# Patient Record
Sex: Female | Born: 1990
Health system: Southern US, Community
[De-identification: ages and names within clinical notes are randomized; demographics above are authoritative.]

## PROBLEM LIST (undated history)

## (undated) DIAGNOSIS — T7840XA Allergy, unspecified, initial encounter: Secondary | ICD-10-CM

## (undated) DIAGNOSIS — F32A Depression, unspecified: Secondary | ICD-10-CM

## (undated) DIAGNOSIS — O139 Gestational [pregnancy-induced] hypertension without significant proteinuria, unspecified trimester: Secondary | ICD-10-CM

## (undated) DIAGNOSIS — K219 Gastro-esophageal reflux disease without esophagitis: Secondary | ICD-10-CM

## (undated) DIAGNOSIS — F419 Anxiety disorder, unspecified: Secondary | ICD-10-CM

## (undated) DIAGNOSIS — E039 Hypothyroidism, unspecified: Secondary | ICD-10-CM

## (undated) HISTORY — DX: Depression, unspecified: F32.A

## (undated) HISTORY — DX: Allergy, unspecified, initial encounter: T78.40XA

## (undated) HISTORY — PX: NO PAST SURGERIES: SHX2092

## (undated) HISTORY — DX: Anxiety disorder, unspecified: F41.9

## (undated) HISTORY — DX: Gastro-esophageal reflux disease without esophagitis: K21.9

## (undated) HISTORY — DX: Hypothyroidism, unspecified: E03.9

---

## 2019-12-10 ENCOUNTER — Encounter: Payer: Self-pay | Admitting: Family Medicine

## 2019-12-10 ENCOUNTER — Ambulatory Visit (INDEPENDENT_AMBULATORY_CARE_PROVIDER_SITE_OTHER): Payer: 59 | Admitting: Family Medicine

## 2019-12-10 ENCOUNTER — Other Ambulatory Visit: Payer: Self-pay

## 2019-12-10 VITALS — BP 124/80 | HR 89 | Resp 12 | Ht 64.0 in | Wt 262.0 lb

## 2019-12-10 DIAGNOSIS — G4700 Insomnia, unspecified: Secondary | ICD-10-CM | POA: Diagnosis not present

## 2019-12-10 DIAGNOSIS — J4520 Mild intermittent asthma, uncomplicated: Secondary | ICD-10-CM

## 2019-12-10 DIAGNOSIS — Z1159 Encounter for screening for other viral diseases: Secondary | ICD-10-CM

## 2019-12-10 DIAGNOSIS — IMO0001 Reserved for inherently not codable concepts without codable children: Secondary | ICD-10-CM | POA: Insufficient documentation

## 2019-12-10 MED ORDER — FLUTICASONE-SALMETEROL 250-50 MCG/DOSE IN AEPB: 1.0000 | INHALATION_SPRAY | Freq: Two times a day (BID) | RESPIRATORY_TRACT | 2 refills | Status: DC

## 2019-12-10 NOTE — Assessment & Plan Note (Signed)
Chronic. Recommend trying to stop Dramamine. Good sleep hygiene. Melatonin 5 to 10 mg 2 to 3 hours before bedtime may help. Follow-up in 2 months.

## 2019-12-10 NOTE — Progress Notes (Signed)
HPI: Sarah Whitney is a 29 y.o. female, who is here today to establish care.  Former PCP: N/A. She moved from [place] to [place] in 03/2019. Last preventive routine visit: 2019.  Chronic medical problems: Asthma, depression, anxiety,and obesity among some.  Diagnosed with anxiety and depression when she was in high school. Currently she is not on medication. She does not think these are active problems at this time.  She has been frustrated because she is not working, stays at home.She does not feel useful. She thinks she may have ADHD and has an appt with [facility] for screening.  She lives with her husband and son. In the past she has used marijuana.  Concerns today:  Asthma: Problem was worse during childhood. She is using albuterol inhaler every 2 days. Until 2 years ago she was on Advair. Nocturnal symptoms are seldom. Symptoms are exacerbated by exertion: Cough, wheezing, and dyspnea. She has not had fever, chills, abnormal weight loss, CP, or palpitations.  Insomnia: Problem has been going on for many years. Difficulty falling asleep,can not "shut my brain down." Negative for PHx and FHx of bipolar disorder.  Currently she is on Dramamine at 50 mg daily. Melatonin did not help. Sleeping about 6-7 hours.  Difficulty losing weight:She stopped sodas in 04/2019. Before pregnancy she was 160-170 Lb.  Cooking her meals. She is not counting calories. Started [employer] 2 weeks ago and has not noted wt loss. She drinks seltzer mixed drinks about 3 times per week.  Review of Systems  Constitutional: Negative for activity change, appetite change, fatigue and fever.  HENT: Negative for mouth sores, nosebleeds and sore throat.   Eyes: Negative for redness and visual disturbance.  Respiratory: Negative for cough, shortness of breath and wheezing.   Cardiovascular: Negative for chest pain, palpitations and leg swelling.   Gastrointestinal: Negative for abdominal pain, nausea and vomiting.       Negative for changes in bowel habits.  Genitourinary: Negative for decreased urine volume and hematuria.  Musculoskeletal: Negative for gait problem and myalgias.  Neurological: Negative for syncope, weakness and headaches.  Psychiatric/Behavioral: Negative for confusion and hallucinations.  Rest see pertinent positives and negatives per HPI.  No current outpatient medications on file prior to visit.   No current facility-administered medications on file prior to visit.   Past Medical History:  Diagnosis Date  . Allergy   . Depression    No Known Allergies  History reviewed. No pertinent family history.  Social History   Socioeconomic History  . Marital status: Unknown    Spouse name: Not on file  . Number of children: Not on file  . Years of education: Not on file  . Highest education level: Not on file  Occupational History  . Not on file  Tobacco Use  . Smoking status: Never Smoker  . Smokeless tobacco: Never Used  Substance and Sexual Activity  . Alcohol use: Not on file  . Drug use: Not on file  . Sexual activity: Not on file  Other Topics Concern  . Not on file  Social History Narrative  . Not on file   Social Determinants of Health   Financial Resource Strain:   . Difficulty of Paying Living Expenses:   Food Insecurity:   . Worried About Programme researcher, broadcasting/film/video in the Last Year:   . Barista in the Last Year:   Transportation Needs:   . Freight forwarder (Medical):   Marland Kitchen  Lack of Transportation (Non-Medical):   Physical Activity:   . Days of Exercise per Week:   . Minutes of Exercise per Session:   Stress:   . Feeling of Stress :   Social Connections:   . Frequency of Communication with Friends and Family:   . Frequency of Social Gatherings with Friends and Family:   . Attends Religious Services:   . Active Member of Clubs or Organizations:   . Attends Tax inspector Meetings:   Marland Kitchen Marital Status:    Vitals:   12/10/19 1132  BP: 124/80  Pulse: 89  Resp: 12  SpO2: 97%    Body mass index is 44.97 kg/m.  Physical Exam Vitals and nursing note reviewed.  Constitutional:      General: She is not in acute distress.    Appearance: She is well-developed.  HENT:     Head: Normocephalic and atraumatic.     Nose: Septal deviation and rhinorrhea present.     Comments: Narrowed left nostril.    Mouth/Throat:     Mouth: Mucous membranes are moist.     Pharynx: Oropharynx is clear.  Eyes:     Conjunctiva/sclera: Conjunctivae normal.     Pupils: Pupils are equal, round, and reactive to light.  Cardiovascular:     Rate and Rhythm: Normal rate and regular rhythm.     Pulses:          Dorsalis pedis pulses are 2+ on the right side and 2+ on the left side.     Heart sounds: No murmur heard.   Pulmonary:     Effort: Pulmonary effort is normal. No respiratory distress.     Breath sounds: Normal breath sounds.  Abdominal:     Palpations: Abdomen is soft. There is no hepatomegaly or mass.     Tenderness: There is no abdominal tenderness.  Lymphadenopathy:     Cervical: No cervical adenopathy.  Skin:    General: Skin is warm.     Findings: No erythema or rash.  Neurological:     Mental Status: She is alert and oriented to person, place, and time.     Cranial Nerves: No cranial nerve deficit.     Gait: Gait normal.  Psychiatric:     Comments: Well groomed, good eye contact.    ASSESSMENT AND PLAN:  [name] was seen today for establish care.  Diagnoses and all orders for this visit: Orders Placed This Encounter  Procedures  . Basic metabolic panel  . TSH  . Hepatitis C antibody   Lab Results  Component Value Date   CREATININE 0.94 12/10/2019   BUN 11 12/10/2019   NA 140 12/10/2019   K 4.4 12/10/2019   CL 106 12/10/2019   CO2 23 12/10/2019   Lab Results  Component Value Date   TSH 5.13 (H) 12/10/2019    Insomnia  disorder Chronic. Recommend trying to stop Dramamine. Good sleep hygiene. Melatonin 5 to 10 mg 2 to 3 hours before bedtime may help. Follow-up in 2 months.  Moderate intermittent asthma Problem is not well controlled. She has tried allergy in the past and well-tolerated, so Advair 250-50 mcg twice daily started today. Continue albuterol 2 puffs every 4-6 hours as needed for asthma exacerbation. Weight loss will help. Follow-up in 2 months.  Morbid obesity (HCC) We discussed diagnosis, prognosis, and treatment options. Encouraged consistency with following a healthful diet and engaging in regular physical activity. 150 minutes of moderate intensity exercise per week. I think weight  watchers is a good option. We will hold on pharmacologic treatment for now.  Encounter for HCV screening test for low risk patient -     Hepatitis C antibody   Return in about 2 months (around 02/10/2020) for asthma, wt.    Stepahnie Campo G. [place], MD  [facility]. [place] office.  A few things to remember from today's visit:  If you need refills please call your pharmacy. Do not use My Chart to request refills or for acute issues that need immediate attention.   Advair started today,let me know if your insurance does not cover it. 150 min of wekly moderate intensity exercise. Continue [employer].  Melatonin 5-10 mg 2-3 hours before bedtime and good sleep hygiene.

## 2019-12-10 NOTE — Assessment & Plan Note (Signed)
Problem is not well controlled. She has tried allergy in the past and well-tolerated, so Advair 250-50 mcg twice daily started today. Continue albuterol 2 puffs every 4-6 hours as needed for asthma exacerbation. Weight loss will help. Follow-up in 2 months.

## 2019-12-10 NOTE — Assessment & Plan Note (Signed)
We discussed diagnosis, prognosis, and treatment options. Encouraged consistency with following a healthful diet and engaging in regular physical activity. 150 minutes of moderate intensity exercise per week. I think weight watchers is a good option. We will hold on pharmacologic treatment for now.

## 2019-12-10 NOTE — Patient Instructions (Signed)
A few things to remember from today's visit:  If you need refills please call your pharmacy. Do not use My Chart to request refills or for acute issues that need immediate attention.   Advair started today,let me know if your insurance does not cover it. 150 min of wekly moderate intensity exercise. Continue [employer].  Melatonin 5-10 mg 2-3 hours before bedtime and good sleep hygiene.

## 2019-12-13 LAB — BASIC METABOLIC PANEL
BUN: 11 mg/dL (ref 7–25)
CO2: 23 mmol/L (ref 20–32)
Calcium: 9.3 mg/dL (ref 8.6–10.2)
Chloride: 106 mmol/L (ref 98–110)
Creat: 0.94 mg/dL (ref 0.50–1.10)
Glucose, Bld: 86 mg/dL (ref 65–99)
Potassium: 4.4 mmol/L (ref 3.5–5.3)
Sodium: 140 mmol/L (ref 135–146)

## 2019-12-13 LAB — TSH: TSH: 5.13 mIU/L — ABNORMAL HIGH

## 2019-12-13 LAB — HEPATITIS C ANTIBODY
Hepatitis C Ab: NONREACTIVE
SIGNAL TO CUT-OFF: 0.01 (ref <1.00)

## 2019-12-14 ENCOUNTER — Telehealth: Payer: Self-pay

## 2019-12-14 NOTE — Telephone Encounter (Signed)
When I was giving pt her lab results, she said that the Advair is not covered by her insurance company.

## 2019-12-15 ENCOUNTER — Telehealth: Payer: Self-pay | Admitting: Family Medicine

## 2019-12-15 DIAGNOSIS — R7989 Other specified abnormal findings of blood chemistry: Secondary | ICD-10-CM

## 2019-12-15 NOTE — Telephone Encounter (Signed)
Checked pt's formulary - Advair is on it but the cost is over $300 at the pharmacy.

## 2019-12-15 NOTE — Telephone Encounter (Signed)
Pt called to get more clarification about her treatment plan. [phone]  Please advise.

## 2019-12-15 NOTE — Telephone Encounter (Signed)
I left pt a voicemail to return my call. 

## 2019-12-17 NOTE — Telephone Encounter (Signed)
I spoke with pt. She would like to have her TSH rechecked sooner than 2 months. Okay to place TSH order for next week & make lab appt?

## 2019-12-20 MED ORDER — ALBUTEROL SULFATE HFA 108 (90 BASE) MCG/ACT IN AERS
2.0000 | INHALATION_SPRAY | Freq: Four times a day (QID) | RESPIRATORY_TRACT | 2 refills | Status: DC | PRN
Start: 2019-12-20 — End: 2020-03-31

## 2019-12-20 NOTE — Telephone Encounter (Signed)
Lab appt made for Friday at 9am. Pt is not taking any biotin supplements. Pt asked for refill on her rescue inhaler that was discussed during last OV, Rx sent in.

## 2019-12-20 NOTE — Telephone Encounter (Signed)
It is Ok to repeat TSH sooner. Ideally in the morning , 8-10 am. If taking biotin supplementation she needs to stop it for 2 weeks. Thanks, BJ

## 2019-12-20 NOTE — Telephone Encounter (Signed)
I left a voicemail for patient to return my call.

## 2019-12-21 NOTE — Telephone Encounter (Signed)
Symbicort should be more affordable per the insurance list. Which strength do you want to send?

## 2019-12-21 NOTE — Telephone Encounter (Signed)
Options are Symbicort,Dulera,and breo. Thanks, BJ

## 2019-12-22 ENCOUNTER — Other Ambulatory Visit: Payer: Self-pay | Admitting: Family Medicine

## 2019-12-22 DIAGNOSIS — IMO0001 Reserved for inherently not codable concepts without codable children: Secondary | ICD-10-CM

## 2019-12-22 MED ORDER — BUDESONIDE-FORMOTEROL FUMARATE 160-4.5 MCG/ACT IN AERO: 2.0000 | INHALATION_SPRAY | Freq: Two times a day (BID) | RESPIRATORY_TRACT | 3 refills | Status: DC

## 2019-12-22 NOTE — Progress Notes (Signed)
symb

## 2019-12-22 NOTE — Addendum Note (Signed)
Addended by: [place], Tanga Gloor G on: 12/22/2019 03:46 PM   Modules accepted: Orders

## 2019-12-22 NOTE — Telephone Encounter (Signed)
Symbicort 160-4.5 mcg to use 2 puff bid sent to her pharmacy. Thanks, BJ

## 2019-12-24 ENCOUNTER — Other Ambulatory Visit: Payer: 59

## 2019-12-24 ENCOUNTER — Other Ambulatory Visit: Payer: Self-pay

## 2019-12-24 DIAGNOSIS — E039 Hypothyroidism, unspecified: Secondary | ICD-10-CM

## 2019-12-25 LAB — TSH: TSH: 12.77 mIU/L — ABNORMAL HIGH

## 2019-12-31 ENCOUNTER — Telehealth: Payer: Self-pay | Admitting: Family Medicine

## 2019-12-31 DIAGNOSIS — E039 Hypothyroidism, unspecified: Secondary | ICD-10-CM | POA: Insufficient documentation

## 2019-12-31 MED ORDER — LEVOTHYROXINE SODIUM 50 MCG PO TABS: 50.0000 ug | ORAL_TABLET | Freq: Every day | ORAL | 0 refills | Status: DC

## 2019-12-31 NOTE — Telephone Encounter (Signed)
Pt call and want a call back about her labs. °

## 2019-12-31 NOTE — Telephone Encounter (Signed)
See result note.  

## 2020-01-05 ENCOUNTER — Encounter: Payer: Self-pay | Admitting: Family Medicine

## 2020-02-04 ENCOUNTER — Other Ambulatory Visit: Payer: 59

## 2020-02-17 ENCOUNTER — Other Ambulatory Visit: Payer: Self-pay

## 2020-02-18 ENCOUNTER — Other Ambulatory Visit: Payer: 59

## 2020-02-18 DIAGNOSIS — E039 Hypothyroidism, unspecified: Secondary | ICD-10-CM

## 2020-02-19 LAB — TSH: TSH: 3.59 mIU/L

## 2020-02-23 ENCOUNTER — Other Ambulatory Visit: Payer: Self-pay | Admitting: Family Medicine

## 2020-02-28 ENCOUNTER — Ambulatory Visit: Payer: 59 | Admitting: Family Medicine

## 2020-02-28 ENCOUNTER — Ambulatory Visit (INDEPENDENT_AMBULATORY_CARE_PROVIDER_SITE_OTHER): Payer: 59 | Admitting: Family Medicine

## 2020-02-28 ENCOUNTER — Encounter: Payer: Self-pay | Admitting: Family Medicine

## 2020-02-28 ENCOUNTER — Other Ambulatory Visit: Payer: Self-pay

## 2020-02-28 VITALS — BP 126/76 | HR 98 | Resp 16 | Ht 64.0 in | Wt 264.0 lb

## 2020-02-28 DIAGNOSIS — F330 Major depressive disorder, recurrent, mild: Secondary | ICD-10-CM | POA: Diagnosis not present

## 2020-02-28 DIAGNOSIS — F902 Attention-deficit hyperactivity disorder, combined type: Secondary | ICD-10-CM | POA: Diagnosis not present

## 2020-02-28 DIAGNOSIS — Z23 Encounter for immunization: Secondary | ICD-10-CM | POA: Diagnosis not present

## 2020-02-28 DIAGNOSIS — F411 Generalized anxiety disorder: Secondary | ICD-10-CM

## 2020-02-28 MED ORDER — METHYLPHENIDATE HCL ER 20 MG PO TBCR: 20.0000 mg | EXTENDED_RELEASE_TABLET | Freq: Every day | ORAL | 0 refills | Status: DC

## 2020-02-28 NOTE — Patient Instructions (Addendum)
A few things to remember from today's visit:   Attention deficit hyperactivity disorder (ADHD), combined type - Plan: methylphenidate (METADATE ER) 20 MG ER tablet  GAD (generalized anxiety disorder)  Depression, major, recurrent, mild (HCC)   Attention Deficit Hyperactivity Disorder, Adult Attention deficit hyperactivity disorder (ADHD) is a mental health disorder that starts during childhood (neurodevelopmental disorder). For many people with ADHD, the disorder continues into the adult years. Treatment can help you manage your symptoms. What are the causes? The exact cause of ADHD is not known. Most experts believe genetics and environmental factors contribute to ADHD. What increases the risk? The following factors may make you more likely to develop this condition:  Having a family history of ADHD.  Being female.  Being born to a mother who smoked or drank alcohol during pregnancy.  Being exposed to lead or other toxins in the womb or early in life.  Being born before 37 weeks of pregnancy (prematurely) or at a low birth weight.  Having experienced a brain injury. What are the signs or symptoms? Symptoms of this condition depend on the type of ADHD. The two main types are inattentive and hyperactive-impulsive. Some people may have symptoms of both types. Symptoms of the inattentive type include:  Difficulty paying attention.  Making careless mistakes.  Not following instructions.  Being disorganized.  Avoiding tasks that require time and attention.  Losing and forgetting things.  Being easily distracted. Symptoms of the hyperactive-impulsive type include:  Restlessness.  Talking too much.  Interrupting.  Difficulty with: ? Sitting still. ? Feeling motivated. ? Relaxing. ? Waiting in line or waiting for a turn. In adults, this condition may lead to certain problems, such as:  Keeping jobs.  Performing tasks at work.  Having stable  relationships.  Being on time or keeping to a schedule. How is this diagnosed? This condition is diagnosed based on your current symptoms and your history of symptoms. The diagnosis can be made by a health care provider such as a primary care provider or a mental health care specialist. Your health care provider may use a symptom checklist or a behavior rating scale to evaluate your symptoms. He or she may also want to talk with people who have observed your behaviors throughout your life. How is this treated? This condition can be treated with medicines and behavior therapy. Medicines may be the best option to reduce impulsive behaviors and improve attention. Your health care provider may recommend:  Stimulant medicines. These are the most common medicines used for adult ADHD. They affect certain chemicals in the brain (neurotransmitters) and improve your ability to control your symptoms.  A non-stimulant medicine for adult ADHD (atomoxetine). This medicine increases a neurotransmitter called norepinephrine. It may take weeks to months to see effects from this medicine. Counseling and behavioral management are also important for treating ADHD. Counseling is often used along with medicine. Your health care provider may suggest:  Cognitive behavioral therapy (CBT). This type of therapy teaches you to replace negative thoughts and actions with positive thoughts and actions. When used as part of ADHD treatment, this therapy may also include: ? Coping strategies for organization, time management, impulse control, and stress reduction. ? Mindfulness and meditation training.  Behavioral management. You may work with a Psychologist, occupational who is specially trained to help people with ADHD manage and organize activities and function more effectively. Follow these instructions at home: Medicines   Take over-the-counter and prescription medicines only as told by your health care provider.  Talk with your health care  provider about the possible side effects of your medicines and how to manage them. Lifestyle   Do not use drugs.  Do not drink alcohol if: ? Your health care provider tells you not to drink. ? You are pregnant, may be pregnant, or are planning to become pregnant.  If you drink alcohol: ? Limit how much you use to:  0-1 drink a day for women.  0-2 drinks a day for men. ? Be aware of how much alcohol is in your drink. In the [place] one drink equals one 12 oz bottle of beer (355 mL), one 5 oz glass of wine (148 mL), or one 1 oz glass of hard liquor (44 mL).  Get enough sleep.  Eat a healthy diet.  Exercise regularly. Exercise can help to reduce stress and anxiety. General instructions  Learn as much as you can about adult ADHD, and work closely with your health care providers to find the treatments that work best for you.  Follow the same schedule each day.  Use reminder devices like notes, calendars, and phone apps to stay on time and organized.  Keep all follow-up visits as told by your health care provider and therapist. This is important. Where to find more information A health care provider may be able to recommend resources that are available online or over the phone. You could start with:  Attention Deficit Disorder Association (ADDA): http://davis-dillon.net/  [employer] of Mental Health [facility]): http://www.maynard.net/ Contact a health care provider if:  Your symptoms continue to cause problems.  You have side effects from your medicine, such as: ? Repeated muscle twitches, coughing, or speech outbursts. ? Sleep problems. ? Loss of appetite. ? Dizziness. ? Unusually fast heartbeat. ? Stomach pains. ? Headaches.  You are struggling with anxiety, depression, or substance abuse. Get help right away if you:  Have a severe reaction to a medicine. If you ever feel like you may hurt yourself or others, or have thoughts about taking your own life, get help right away. You can  go to the nearest emergency department or call:  Your local emergency services (911 in the [place]  A suicide crisis helpline, such as the National Suicide Prevention Lifeline at 614-067-9604. This is open 24 hours a day. Summary  ADHD is a mental health disorder that starts during childhood (neurodevelopmental disorder) and often continues into the adult years.  The exact cause of ADHD is not known. Most experts believe genetics and environmental factors contribute to ADHD.  There is no cure for ADHD, but treatment with medicine, cognitive behavioral therapy, or behavioral management can help you manage your condition. This information is not intended to replace advice given to you by your health care provider. Make sure you discuss any questions you have with your health care provider. Document Revised: 10/05/2018 Document Reviewed: 10/05/2018 Elsevier Patient Education  2020 [employer].  Please be sure medication list is accurate. If a new problem present, please set up appointment sooner than planned today.  PSYCHIATRIC OFFICES AND PSYCHIATRISTS IN THE AREA  When psychiatric evaluation is discussed or recommended referral is not necessary. This is a list of options around [place], [place] that you can call and arrange an appointment.  -[facility] [phone] -[facility] [phone] - [facility] [phone] -[facility] [phone]  Dr Annabell Sabal [phone] Dr Marguerita Beards, MD [phone]  Dr Milagros Evener, MD [phone])   Dr Jacqulynn Cadet. Bernardo Heater, MD [phone] Dr Madie Reno A. [facility]  Dr Andee Poles, MD [phone])  Dr Mar Daring, MD [phone]  Dr Laverna Peace [phone]

## 2020-02-28 NOTE — Progress Notes (Signed)
Chief Complaint  Patient presents with  . adhd screening   HPI: Sarah Whitney is a 29 y.o. female with hx of asthma,hypothyroidism,and obesity here today concerned about possible ADHD/ADD. She has no prior hx but during high school she felt like she had ADHD.  States that she was tested by pediatrician but she felt like her symptoms were disregarded. She has been dxed with generalized anxiety disorder. She has not seen psychiatrics of psychologist, she is interested in establishing care.  Has dropped out of college a few times, she cannot keep up with assignments, easily distracted. Disorganized, problems prioritizing what needed to be done, behind on work.  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 She has difficulty with completing chores, starts many at the dame time and ends up not completing anyone. She is not encouraged to go back to school because she doe snot feel she will succeed.   Adult ADHD Self Report Scale (most recent)    Adult ADHD Self-Report Scale (ASRS-v1.1) Symptom Checklist - 02/28/20 1636      Part A   1. How often do you have trouble wrapping up the final details of a project, once the challenging parts have been done? Very Often  2. How often do you have difficulty getting things done in order when you have to do a task that requires organization? Very Often    3. How often do you have problems remembering appointments or obligations? Often  4. When you have a task that requires a lot of thought, how often do you avoid or delay getting started? Very Often    5. How often do you fidget or squirm with your hands or feet when you have to sit down for a long time? Often  6. How often do you feel overly active and compelled to do things, like you were driven by a motor? Sometimes      Part B   7. How often do you make careless mistakes when you have to work on a boring or difficult project? Often  8. How often do you have difficulty keeping your attention when you are  doing boring or repetitive work? Very Often    9. How often do you have difficulty concentrating on what people say to you, even when they are speaking to you directly? Sometimes  10. How often do you misplace or have difficulty finding things at home or at work? Very Often    11. How often are you distracted by activity or noise around you? Very Often  12. How often do you leave your seat in meetings or other situations in which you are expected to remain seated? Rarely    13. How often do you feel restless or fidgety? Sometimes  14. How often do you have difficulty unwinding and relaxing when you have time to yourself? Very Often    15. How often do you find yourself talking too much when you are in social situations? Rarely  16. When you are in a conversation, how often do you find yourself finishing the sentences of the people you are talking to, before they can finish them themselves? Often    17. How often do you have difficulty waiting your turn in situations when turn taking is required? Rarely  18. How often do you interrupt others when they are busy? Often           Some depression but she thinks it is aggravated by the fact she does  not feel productive.   [facility] 02/28/2020 12/10/2019  Decreased Interest 1 0  Down, Depressed, Hopeless 1 0  PHQ - 2 Score 2 0  Altered sleeping 3 -  Tired, decreased energy 0 -  Change in appetite 1 -  Feeling bad or failure about yourself  1 -  Trouble concentrating 1 -  Moving slowly or fidgety/restless 2 -  Suicidal thoughts 0 -  PHQ-9 Score 10 -  Difficult doing work/chores Somewhat difficult -   GAD 7 : Generalized Anxiety Score 02/28/2020  Nervous, Anxious, on Edge 2  Control/stop worrying 2  Worry too much - different things 3  Trouble relaxing 2  Restless 1  Easily annoyed or irritable 2  Afraid - awful might happen 2  Total GAD 7 Score 14  Anxiety Difficulty Very difficult   Review of Systems  Constitutional:  Positive for fatigue. Negative for activity change and appetite change.  Gastrointestinal: Negative for abdominal pain, nausea and vomiting.  Endocrine: Negative for cold intolerance and heat intolerance.  Musculoskeletal: Negative for gait problem and myalgias.  Neurological: Negative for syncope, weakness and headaches.  Psychiatric/Behavioral: Positive for sleep disturbance. Negative for confusion and hallucinations. The patient is nervous/anxious.   Rest see pertinent positives and negatives per HPI.  Current Outpatient Medications on File Prior to Visit  Medication Sig Dispense Refill  . albuterol (VENTOLIN HFA) 108 (90 Base) MCG/ACT inhaler Inhale 2 puffs into the lungs every 6 (six) hours as needed for wheezing or shortness of breath. 8.5 g 2  . budesonide-formoterol (SYMBICORT) 160-4.5 MCG/ACT inhaler Inhale 2 puffs into the lungs 2 (two) times daily. 1 Inhaler 3  . levothyroxine (SYNTHROID) 50 MCG tablet TAKE 1 TABLET BY MOUTH DAILY BEFORE BREAKFAST 30 tablet 2   No current facility-administered medications on file prior to visit.   Past Medical History:  Diagnosis Date  . Allergy   . Depression    No Known Allergies  Social History   Socioeconomic History  . Marital status: Married    Spouse name: Not on file  . Number of children: Not on file  . Years of education: Not on file  . Highest education level: Not on file  Occupational History  . Not on file  Tobacco Use  . Smoking status: Never Smoker  . Smokeless tobacco: Never Used  Substance and Sexual Activity  . Alcohol use: Not on file  . Drug use: Not on file  . Sexual activity: Not on file  Other Topics Concern  . Not on file  Social History Narrative  . Not on file   Social Determinants of Health   Financial Resource Strain:   . Difficulty of Paying Living Expenses: Not on file  Food Insecurity:   . Worried About Programme researcher, broadcasting/film/videounning Out of Food in the Last Year: Not on file  . Ran Out of Food in the Last Year:  Not on file  Transportation Needs:   . Lack of Transportation (Medical): Not on file  . Lack of Transportation (Non-Medical): Not on file  Physical Activity:   . Days of Exercise per Week: Not on file  . Minutes of Exercise per Session: Not on file  Stress:   . Feeling of Stress : Not on file  Social Connections:   . Frequency of Communication with Friends and Family: Not on file  . Frequency of Social Gatherings with Friends and Family: Not on file  . Attends Religious Services: Not on file  . Active Member  of Clubs or Organizations: Not on file  . Attends Banker Meetings: Not on file  . Marital Status: Not on file    Vitals:   02/28/20 1553  BP: 126/76  Pulse: 98  Resp: 16  SpO2: 97%   Body mass index is 45.32 kg/m.  Physical Exam Constitutional:      General: She is not in acute distress.    Appearance: She is well-developed.  Eyes:     Conjunctiva/sclera: Conjunctivae normal.     Pupils: Pupils are equal, round, and reactive to light.  Cardiovascular:     Rate and Rhythm: Normal rate and regular rhythm.     Heart sounds: No murmur heard.   Pulmonary:     Effort: Pulmonary effort is normal. No respiratory distress.     Breath sounds: Normal breath sounds.  Skin:    General: Skin is warm.     Findings: No erythema.  Neurological:     General: No focal deficit present.     Mental Status: She is alert and oriented to person, place, and time.     Gait: Gait normal.  Psychiatric:        Mood and Affect: Affect normal. Mood is anxious.        Thought Content: Thought content does not include suicidal ideation. Thought content does not include suicidal plan.    ASSESSMENT AND PLAN:  Sarah Whitney was seen today for adhd screening.  Diagnoses and all orders for this visit:  Attention deficit hyperactivity disorder (ADHD), combined type We discussed dx criteria, prognosis,and treatment options. ASRS is suggestive of ADHD. She agrees with trying  Methylphenidate. We discussed some side effects.  -     methylphenidate (METADATE ER) 20 MG ER tablet; Take 1 tablet (20 mg total) by mouth daily before breakfast.  GAD (generalized anxiety disorder) For now we will hold on pharmacologic treatment. CBT will help. Stimulants may aggravate problem.  Depression, major, recurrent, mild (HCC) Mild. It seems to be aggravated by problem #1, so no pharmacologic treatment for now.  Need for influenza vaccination -     Flu Vaccine QUAD 36+ mos IM    Return in about 4 weeks (around 03/27/2020).   Latasha Puskas G. [place], MD  [facility]. [place] office.    A few things to remember from today's visit:   Attention deficit hyperactivity disorder (ADHD), combined type - Plan: methylphenidate (METADATE ER) 20 MG ER tablet  GAD (generalized anxiety disorder)  Depression, major, recurrent, mild (HCC)   Attention Deficit Hyperactivity Disorder, Adult Attention deficit hyperactivity disorder (ADHD) is a mental health disorder that starts during childhood (neurodevelopmental disorder). For many people with ADHD, the disorder continues into the adult years. Treatment can help you manage your symptoms. What are the causes? The exact cause of ADHD is not known. Most experts believe genetics and environmental factors contribute to ADHD. What increases the risk? The following factors may make you more likely to develop this condition:  Having a family history of ADHD.  Being female.  Being born to a mother who smoked or drank alcohol during pregnancy.  Being exposed to lead or other toxins in the womb or early in life.  Being born before 37 weeks of pregnancy (prematurely) or at a low birth weight.  Having experienced a brain injury. What are the signs or symptoms? Symptoms of this condition depend on the type of ADHD. The two main types are inattentive and hyperactive-impulsive. Some people may have symptoms of  both types. Symptoms  of the inattentive type include:  Difficulty paying attention.  Making careless mistakes.  Not following instructions.  Being disorganized.  Avoiding tasks that require time and attention.  Losing and forgetting things.  Being easily distracted. Symptoms of the hyperactive-impulsive type include:  Restlessness.  Talking too much.  Interrupting.  Difficulty with: ? Sitting still. ? Feeling motivated. ? Relaxing. ? Waiting in line or waiting for a turn. In adults, this condition may lead to certain problems, such as:  Keeping jobs.  Performing tasks at work.  Having stable relationships.  Being on time or keeping to a schedule. How is this diagnosed? This condition is diagnosed based on your current symptoms and your history of symptoms. The diagnosis can be made by a health care provider such as a primary care provider or a mental health care specialist. Your health care provider may use a symptom checklist or a behavior rating scale to evaluate your symptoms. He or she may also want to talk with people who have observed your behaviors throughout your life. How is this treated? This condition can be treated with medicines and behavior therapy. Medicines may be the best option to reduce impulsive behaviors and improve attention. Your health care provider may recommend:  Stimulant medicines. These are the most common medicines used for adult ADHD. They affect certain chemicals in the brain (neurotransmitters) and improve your ability to control your symptoms.  A non-stimulant medicine for adult ADHD (atomoxetine). This medicine increases a neurotransmitter called norepinephrine. It may take weeks to months to see effects from this medicine. Counseling and behavioral management are also important for treating ADHD. Counseling is often used along with medicine. Your health care provider may suggest:  Cognitive behavioral therapy (CBT). This type of therapy teaches you to  replace negative thoughts and actions with positive thoughts and actions. When used as part of ADHD treatment, this therapy may also include: ? Coping strategies for organization, time management, impulse control, and stress reduction. ? Mindfulness and meditation training.  Behavioral management. You may work with a Psychologist, occupational who is specially trained to help people with ADHD manage and organize activities and function more effectively. Follow these instructions at home: Medicines   Take over-the-counter and prescription medicines only as told by your health care provider.  Talk with your health care provider about the possible side effects of your medicines and how to manage them. Lifestyle   Do not use drugs.  Do not drink alcohol if: ? Your health care provider tells you not to drink. ? You are pregnant, may be pregnant, or are planning to become pregnant.  If you drink alcohol: ? Limit how much you use to:  0-1 drink a day for women.  0-2 drinks a day for men. ? Be aware of how much alcohol is in your drink. In the [place] one drink equals one 12 oz bottle of beer (355 mL), one 5 oz glass of wine (148 mL), or one 1 oz glass of hard liquor (44 mL).  Get enough sleep.  Eat a healthy diet.  Exercise regularly. Exercise can help to reduce stress and anxiety. General instructions  Learn as much as you can about adult ADHD, and work closely with your health care providers to find the treatments that work best for you.  Follow the same schedule each day.  Use reminder devices like notes, calendars, and phone apps to stay on time and organized.  Keep all follow-up visits as told by your  health care provider and therapist. This is important. Where to find more information A health care provider may be able to recommend resources that are available online or over the phone. You could start with:  Attention Deficit Disorder Association (ADDA): http://davis-dillon.net/  [employer] of  Mental Health [facility]): http://www.maynard.net/ Contact a health care provider if:  Your symptoms continue to cause problems.  You have side effects from your medicine, such as: ? Repeated muscle twitches, coughing, or speech outbursts. ? Sleep problems. ? Loss of appetite. ? Dizziness. ? Unusually fast heartbeat. ? Stomach pains. ? Headaches.  You are struggling with anxiety, depression, or substance abuse. Get help right away if you:  Have a severe reaction to a medicine. If you ever feel like you may hurt yourself or others, or have thoughts about taking your own life, get help right away. You can go to the nearest emergency department or call:  Your local emergency services (911 in the [place]  A suicide crisis helpline, such as the National Suicide Prevention Lifeline at 7570028172. This is open 24 hours a day. Summary  ADHD is a mental health disorder that starts during childhood (neurodevelopmental disorder) and often continues into the adult years.  The exact cause of ADHD is not known. Most experts believe genetics and environmental factors contribute to ADHD.  There is no cure for ADHD, but treatment with medicine, cognitive behavioral therapy, or behavioral management can help you manage your condition. This information is not intended to replace advice given to you by your health care provider. Make sure you discuss any questions you have with your health care provider. Document Revised: 10/05/2018 Document Reviewed: 10/05/2018 Elsevier Patient Education  2020 [employer].  Please be sure medication list is accurate. If a new problem present, please set up appointment sooner than planned today.  PSYCHIATRIC OFFICES AND PSYCHIATRISTS IN THE AREA  When psychiatric evaluation is discussed or recommended referral is not necessary. This is a list of options around [place], [place] that you can call and arrange an appointment.  -[facility] [phone] -[facility] [phone] [facility] [phone] -[facility] [phone]  Dr Annabell Sabal [phone] Dr Marguerita Beards, MD [phone] Dr Milagros Evener, MD [phone])   Dr Jacqulynn Cadet. Bernardo Heater, MD [phone] Dr Madie Reno A. [facility]  Dr Andee Poles, MD [phone])  Dr Mar Daring, MD [phone]  Dr Laverna Peace [phone]

## 2020-03-09 ENCOUNTER — Encounter: Payer: Self-pay | Admitting: Family Medicine

## 2020-03-13 ENCOUNTER — Encounter: Payer: Self-pay | Admitting: Family Medicine

## 2020-03-17 ENCOUNTER — Encounter: Payer: Self-pay | Admitting: Family Medicine

## 2020-03-20 ENCOUNTER — Other Ambulatory Visit: Payer: Self-pay | Admitting: Family Medicine

## 2020-03-20 MED ORDER — LISDEXAMFETAMINE DIMESYLATE 30 MG PO CAPS: 30.0000 mg | ORAL_CAPSULE | Freq: Every day | ORAL | 0 refills | Status: DC

## 2020-03-30 ENCOUNTER — Other Ambulatory Visit: Payer: Self-pay | Admitting: Family Medicine

## 2020-04-07 ENCOUNTER — Telehealth: Payer: Self-pay

## 2020-04-07 NOTE — Telephone Encounter (Signed)
I spoke with patient. The vyvanse is $100+ for her through her insurance. She is wondering if there is another medication that can be tried that has a generic available?  Pt uses [facility] [address]

## 2020-04-12 ENCOUNTER — Telehealth (INDEPENDENT_AMBULATORY_CARE_PROVIDER_SITE_OTHER): Payer: 59 | Admitting: Family Medicine

## 2020-04-12 ENCOUNTER — Encounter: Payer: Self-pay | Admitting: Family Medicine

## 2020-04-12 VITALS — Ht 64.0 in

## 2020-04-12 DIAGNOSIS — F902 Attention-deficit hyperactivity disorder, combined type: Secondary | ICD-10-CM | POA: Diagnosis not present

## 2020-04-12 MED ORDER — AMPHETAMINE-DEXTROAMPHET ER 20 MG PO CP24: 20.0000 mg | ORAL_CAPSULE | ORAL | 0 refills | Status: DC

## 2020-04-12 NOTE — Progress Notes (Addendum)
Virtual Visit via Video Note I connected with Kennedee on 04/12/20 by a video enabled telemedicine application and verified that I am speaking with the correct person using two identifiers.  Location patient: home Location provider:work office Persons participating in the virtual visit: patient, provider  I discussed the limitations of evaluation and management by telemedicine and the availability of in person appointments. The patient expressed understanding and agreed to proceed.   HPI: Farrin is a 29 yo female with hx of allergies,andxiety,and depression following on ADHD. She was diagnosed with ADHD on 02/28/2020, when she expressed some concerns about having difficulty with concentration for years. She has top of college few times, could not keep up with assignments and she was easily distracted.  Adult ADHD self-report scale was suggestive of ADHD. She was started on methylphenidate 20 mg, which she did not feel like it helped. Changed to Vyvanse 30 mg on 03/13/2020, she has felt like this medication is helping but she cannot afford it.  No significant side effects, except for "jittery" sensation when first started.  She does not think depression and anxiety are aggravating problem.  ROS: See pertinent positives and negatives per HPI.  Past Medical History:  Diagnosis Date  . Allergy   . Depression    History reviewed. No pertinent surgical history.  History reviewed. No pertinent family history.  Social History   Socioeconomic History  . Marital status: Married    Spouse name: Not on file  . Number of children: Not on file  . Years of education: Not on file  . Highest education level: Not on file  Occupational History  . Not on file  Tobacco Use  . Smoking status: Never Smoker  . Smokeless tobacco: Never Used  Substance and Sexual Activity  . Alcohol use: Not on file  . Drug use: Not on file  . Sexual activity: Not on file  Other Topics Concern  . Not on file   Social History Narrative  . Not on file   Social Determinants of Health   Financial Resource Strain:   . Difficulty of Paying Living Expenses: Not on file  Food Insecurity:   . Worried About Programme researcher, broadcasting/film/video in the Last Year: Not on file  . Ran Out of Food in the Last Year: Not on file  Transportation Needs:   . Lack of Transportation (Medical): Not on file  . Lack of Transportation (Non-Medical): Not on file  Physical Activity:   . Days of Exercise per Week: Not on file  . Minutes of Exercise per Session: Not on file  Stress:   . Feeling of Stress : Not on file  Social Connections:   . Frequency of Communication with Friends and Family: Not on file  . Frequency of Social Gatherings with Friends and Family: Not on file  . Attends Religious Services: Not on file  . Active Member of Clubs or Organizations: Not on file  . Attends Banker Meetings: Not on file  . Marital Status: Not on file  Intimate Partner Violence:   . Fear of Current or Ex-Partner: Not on file  . Emotionally Abused: Not on file  . Physically Abused: Not on file  . Sexually Abused: Not on file    Current Outpatient Medications:  .  albuterol (VENTOLIN HFA) 108 (90 Base) MCG/ACT inhaler, TAKE 2 PUFFS BY MOUTH EVERY 6 HOURS AS NEEDED FOR WHEEZE OR SHORTNESS OF BREATH, Disp: 8.5 each, Rfl: 2 .  budesonide-formoterol (SYMBICORT) 160-4.5 MCG/ACT  inhaler, Inhale 2 puffs into the lungs 2 (two) times daily., Disp: 1 Inhaler, Rfl: 3 .  levothyroxine (SYNTHROID) 50 MCG tablet, TAKE 1 TABLET BY MOUTH DAILY BEFORE BREAKFAST, Disp: 30 tablet, Rfl: 2 .  amphetamine-dextroamphetamine (ADDERALL XR) 20 MG 24 hr capsule, Take 1 capsule (20 mg total) by mouth every morning., Disp: 30 capsule, Rfl: 0  EXAM:  VITALS per patient if applicable:Ht 5\' 4"  (1.626 m)   BMI 45.32 kg/m   GENERAL: alert, oriented, appears well and in no acute distress  HEENT: atraumatic, conjunctiva clear, no obvious abnormalities on  inspection.  LUNGS: on inspection no signs of respiratory distress, breathing rate appears normal, no obvious gross SOB, gasping or wheezing  CV: no obvious cyanosis  PSYCH/NEURO: pleasant and cooperative, no obvious depression or anxiety, speech and thought processing grossly intact  ASSESSMENT AND PLAN:  Discussed the following assessment and plan:  Attention deficit hyperactivity disorder (ADHD), combined type - Plan: amphetamine-dextroamphetamine (ADDERALL XR) 20 MG 24 hr capsule  We discussed other treatment options, she agrees with trying Adderall XR 20 mg daily. Pending to establish with psychiatrist. Encouraged to implement nonpharmacological strategies that may also help. We discussed some side effects. Follow-up in 4 weeks.   I discussed the assessment and treatment plan with the patient. Juana was provided an opportunity to ask questions and all were answered. She agreed with the plan and demonstrated an understanding of the instructions.   No follow-ups on file.  Keion Neels Nash Dimmer, MD

## 2020-04-12 NOTE — Telephone Encounter (Signed)
Patient is calling back and wanted to check status of previous message left, please advise. CB is [phone]

## 2020-04-12 NOTE — Telephone Encounter (Signed)
Appt scheduled

## 2020-04-12 NOTE — Telephone Encounter (Signed)
Patient is okay with 4:30 virtual

## 2020-04-13 NOTE — Progress Notes (Signed)
Patient is scheduled for 12/17 11:30

## 2020-05-12 ENCOUNTER — Ambulatory Visit (INDEPENDENT_AMBULATORY_CARE_PROVIDER_SITE_OTHER): Payer: 59 | Admitting: Family Medicine

## 2020-05-12 ENCOUNTER — Other Ambulatory Visit: Payer: Self-pay

## 2020-05-12 ENCOUNTER — Encounter: Payer: Self-pay | Admitting: Family Medicine

## 2020-05-12 VITALS — BP 120/74 | HR 96 | Temp 98.4°F | Resp 16 | Ht 64.0 in | Wt 252.2 lb

## 2020-05-12 DIAGNOSIS — R110 Nausea: Secondary | ICD-10-CM

## 2020-05-12 DIAGNOSIS — F902 Attention-deficit hyperactivity disorder, combined type: Secondary | ICD-10-CM

## 2020-05-12 DIAGNOSIS — F330 Major depressive disorder, recurrent, mild: Secondary | ICD-10-CM | POA: Diagnosis not present

## 2020-05-12 DIAGNOSIS — K219 Gastro-esophageal reflux disease without esophagitis: Secondary | ICD-10-CM | POA: Diagnosis not present

## 2020-05-12 DIAGNOSIS — K5900 Constipation, unspecified: Secondary | ICD-10-CM

## 2020-05-12 DIAGNOSIS — G4700 Insomnia, unspecified: Secondary | ICD-10-CM

## 2020-05-12 MED ORDER — BUPROPION HCL ER (SR) 100 MG PO TB12: 100.0000 mg | ORAL_TABLET | Freq: Two times a day (BID) | ORAL | 1 refills | Status: DC

## 2020-05-12 MED ORDER — OMEPRAZOLE 40 MG PO CPDR: 40.0000 mg | DELAYED_RELEASE_CAPSULE | Freq: Every day | ORAL | 0 refills | Status: DC

## 2020-05-12 MED ORDER — AMPHETAMINE-DEXTROAMPHET ER 20 MG PO CP24: 20.0000 mg | ORAL_CAPSULE | ORAL | 0 refills | Status: DC

## 2020-05-12 NOTE — Assessment & Plan Note (Signed)
We discussed possible etiologies. She does not think problem has been exacerbated by Adderall. Good sleep hygiene recommended for now. If problem gets worse, we will consider mood stabilizer. Follow-up in 4 weeks.

## 2020-05-12 NOTE — Assessment & Plan Note (Addendum)
This problem could be aggravating ADHD. After discussion of some side effects, she agrees with trying Wellbutrin XR 100 mg twice daily, recommend starting with once daily for a week and then increase if well-tolerated. CBT also recommended, name of some providers in the area on flyer provided today.

## 2020-05-12 NOTE — Assessment & Plan Note (Addendum)
Reporting some improvement but it does not seem to be well controlled. For now continue Adderall XR 20 mg daily. We discussed some side effects of medication. CBT recommended. Wellbutrin added today. Follow-up in 4 weeks, before if needed.

## 2020-05-12 NOTE — Assessment & Plan Note (Signed)
She understands the benefits of weight loss. She has lost about 8 pounds since her last visit. Encouraged regular physical activity and consistency with following a healthful diet.

## 2020-05-12 NOTE — Assessment & Plan Note (Signed)
Problem is not well controlled. She agrees with trying omeprazole 40 mg daily for 3 to 4 weeks. GERD precautions also recommended. Follow-up in 4 weeks, before if needed.

## 2020-05-12 NOTE — Patient Instructions (Addendum)
A few things to remember from today's visit:   Nausea without vomiting  Depression, major, recurrent, mild (HCC) - Plan: buPROPion (WELLBUTRIN SR) 100 MG 12 hr tablet  Gastroesophageal reflux disease without esophagitis - Plan: omeprazole (PRILOSEC) 40 MG capsule  Attention deficit hyperactivity disorder (ADHD), combined type - Plan: amphetamine-dextroamphetamine (ADDERALL XR) 20 MG 24 hr capsule  If you need refills please call your pharmacy. Do not use My Chart to request refills or for acute issues that need immediate attention.   Nausea can be caused by acid reflux. Omeprazole at bedtime for 4 weeks. Wellbutrin added today. For a week take it once daily then increase to 2 times daily. No changes in Adderall. Please arrange appt with psychotherapist.   Please be sure medication list is accurate. If a new problem present, please set up appointment sooner than planned today.

## 2020-05-12 NOTE — Progress Notes (Signed)
[name] is a 29 y.o.female, who is here today for follow-up. She was last seen on 04/12/2020, virtual visit. Since her last visit she has been diagnosed with COVID-19 infection. Symptoms have resolved.  ADHD: Currently she is on Adderall XR 20 mg daily. She feels like current dose is not enough, still her 'brain got stuck", "spacing out" a couple hours after taking medication; although she has noted improvement. She tried Adderall XR 20 mg 2 tabs at the time, it was "too much", noted tremor/jittery sensation.  She is taking medication daily. She has not noted changes in his sleep, states that she "always" has had problems with his sleep. Sleeping about 4 to 5 hours.  Depression screen [facility] 2/9 02/28/2020 12/10/2019  Decreased Interest 1 0  Down, Depressed, Hopeless 1 0  PHQ - 2 Score 2 0  Altered sleeping 3 -  Tired, decreased energy 0 -  Change in appetite 1 -  Feeling bad or failure about yourself  1 -  Trouble concentrating 1 -  Moving slowly or fidgety/restless 2 -  Suicidal thoughts 0 -  PHQ-9 Score 10 -  Difficult doing work/chores Somewhat difficult -   Insomnia: "Little" better. Wakes up a few times per night, 4-5 times. She has tried Melatonin and OTC sleep aids in the past.  Night and daytime racing thoughts, "5 thoughts" at the time and a "little" son in the background. Adderall has helped with these symptoms. Negative for history of bipolar.  She is walking 30 min daily with her child , pushing the stroller , not brisk walking. She is eating less but has not been consistent with following a healthful diet.  Nausea: Please she has had in the past intermittently, it seems like it has been worse with Adderall. Intermittent heartburn, not as bad as it has been in the past. Negative for vomiting, abdominal pain.  + Constipation, mild.  Hypothyroidism on levothyroxine 50 mcg daily. Lab Results  Component Value Date   TSH 3.59 02/18/2020   Review of  Systems  Constitutional: Positive for appetite change and fatigue. Negative for activity change.  HENT: Negative for mouth sores, sore throat and trouble swallowing.   Respiratory: Negative for shortness of breath and wheezing.   Cardiovascular: Negative for chest pain, palpitations and leg swelling.  Endocrine: Negative for cold intolerance and heat intolerance.  Musculoskeletal: Negative for gait problem and myalgias.  Skin: Negative for rash.  Neurological: Negative for tremors, weakness and headaches.  Psychiatric/Behavioral: Positive for sleep disturbance. Negative for confusion, hallucinations and suicidal ideas. The patient is nervous/anxious.    Current Outpatient Medications on File Prior to Visit  Medication Sig Dispense Refill  . albuterol (VENTOLIN HFA) 108 (90 Base) MCG/ACT inhaler TAKE 2 PUFFS BY MOUTH EVERY 6 HOURS AS NEEDED FOR WHEEZE OR SHORTNESS OF BREATH 8.5 each 2  . budesonide-formoterol (SYMBICORT) 160-4.5 MCG/ACT inhaler Inhale 2 puffs into the lungs 2 (two) times daily. 1 Inhaler 3  . levothyroxine (SYNTHROID) 50 MCG tablet TAKE 1 TABLET BY MOUTH DAILY BEFORE BREAKFAST 30 tablet 2   No current facility-administered medications on file prior to visit.    Past Medical History:  Diagnosis Date  . Allergy   . Depression     No Known Allergies  Social History   Socioeconomic History  . Marital status: Married    Spouse name: Not on file  . Number of children: Not on file  . Years of education: Not on file  . Highest  education level: Not on file  Occupational History  . Not on file  Tobacco Use  . Smoking status: Never Smoker  . Smokeless tobacco: Never Used  Substance and Sexual Activity  . Alcohol use: Not on file  . Drug use: Not on file  . Sexual activity: Not on file  Other Topics Concern  . Not on file  Social History Narrative  . Not on file   Social Determinants of Health   Financial Resource Strain: Not on file  Food Insecurity: Not on  file  Transportation Needs: Not on file  Physical Activity: Not on file  Stress: Not on file  Social Connections: Not on file    Vitals:   05/12/20 1142  BP: 120/74  Pulse: 96  Resp: 16  Temp: 98.4 F (36.9 C)  SpO2: 98%   Wt Readings from Last 3 Encounters:  05/12/20 252 lb 3.2 oz (114.4 kg)  02/28/20 264 lb (119.7 kg)  12/10/19 262 lb (118.8 kg)   Body mass index is 43.29 kg/m.  Physical Exam Vitals and nursing note reviewed.  Constitutional:      General: She is not in acute distress.    Appearance: She is well-developed.  HENT:     Head: Normocephalic and atraumatic.     Mouth/Throat:     Mouth: Oropharynx is clear and moist. Mucous membranes are dry.     Pharynx: Oropharynx is clear.  Eyes:     Conjunctiva/sclera: Conjunctivae normal.  Cardiovascular:     Rate and Rhythm: Normal rate and regular rhythm.     Heart sounds: No murmur heard.   Pulmonary:     Effort: Pulmonary effort is normal. No respiratory distress.     Breath sounds: Normal breath sounds.  Abdominal:     Palpations: Abdomen is soft. There is no hepatomegaly or mass.     Tenderness: There is no abdominal tenderness.  Musculoskeletal:        General: No edema.  Skin:    General: Skin is warm.     Findings: No erythema or rash.  Neurological:     General: No focal deficit present.     Mental Status: She is alert and oriented to person, place, and time.     Cranial Nerves: No cranial nerve deficit.     Gait: Gait normal.     Deep Tendon Reflexes: Strength normal.  Psychiatric:        Mood and Affect: Mood is anxious. Affect is not labile.    Assessment and plan  [name] was seen today for follow-up.  Diagnoses and all orders for this visit:  Nausea without vomiting It seems to be chronic. Medication can certainly aggravate problem as well as GERD. PPI started today. Instructed to avoid skipping meals and stay adequately hydrated.  Constipation, unspecified constipation  type Recommend adequate fiber and fluid intake. We will recheck TSH next visit if problem is still present.  Gastroesophageal reflux disease without esophagitis Problem is not well controlled. She agrees with trying omeprazole 40 mg daily for 3 to 4 weeks. GERD precautions also recommended. Follow-up in 4 weeks, before if needed.  Morbid obesity (HCC) She understands the benefits of weight loss. She has lost about 8 pounds since her last visit. Encouraged regular physical activity and consistency with following a healthful diet.  Insomnia disorder We discussed possible etiologies. She does not think problem has been exacerbated by Adderall. Good sleep hygiene recommended for now. If problem gets worse, we will consider mood  stabilizer. Follow-up in 4 weeks.  Depression, major, recurrent, mild (HCC) This problem could be aggravating ADHD. After discussion of some side effects, she agrees with trying Wellbutrin XR 100 mg twice daily, recommend starting with once daily for a week and then increase if well-tolerated. CBT also recommended, name of some providers in the area on flyer provided today.      Attention deficit hyperactivity disorder (ADHD), combined type Reporting some improvement but it does not seem to be well controlled. For now continue Adderall XR 20 mg daily. We discussed some side effects of medication. CBT recommended. Wellbutrin added today. Follow-up in 4 weeks, before if needed.   Spent 41 minutes.  During this time history was obtained and documented, examination was performed, prior labs reviewed, and assessment/plan discussed.  Return in about 4 weeks (around 06/09/2020) for ADHD, depression, sleep.   Tenessa Marsee G. [place], MD  [facility]. [place] office.  A few things to remember from today's visit:   Nausea without vomiting  Depression, major, recurrent, mild (HCC) - Plan: buPROPion (WELLBUTRIN SR) 100 MG 12 hr  tablet  Gastroesophageal reflux disease without esophagitis - Plan: omeprazole (PRILOSEC) 40 MG capsule  Attention deficit hyperactivity disorder (ADHD), combined type - Plan: amphetamine-dextroamphetamine (ADDERALL XR) 20 MG 24 hr capsule  If you need refills please call your pharmacy. Do not use My Chart to request refills or for acute issues that need immediate attention.   Nausea can be caused by acid reflux. Omeprazole at bedtime for 4 weeks. Wellbutrin added today. For a week take it once daily then increase to 2 times daily. No changes in Adderall. Please arrange appt with psychotherapist.   Please be sure medication list is accurate. If a new problem present, please set up appointment sooner than planned today.

## 2020-05-16 ENCOUNTER — Other Ambulatory Visit: Payer: Self-pay

## 2020-05-16 DIAGNOSIS — K219 Gastro-esophageal reflux disease without esophagitis: Secondary | ICD-10-CM

## 2020-05-16 MED ORDER — OMEPRAZOLE 40 MG PO CPDR: 40.0000 mg | DELAYED_RELEASE_CAPSULE | Freq: Every day | ORAL | 1 refills | Status: DC

## 2020-05-25 ENCOUNTER — Other Ambulatory Visit: Payer: Self-pay | Admitting: Family Medicine

## 2020-06-13 ENCOUNTER — Other Ambulatory Visit: Payer: Self-pay | Admitting: Family Medicine

## 2020-06-13 DIAGNOSIS — F902 Attention-deficit hyperactivity disorder, combined type: Secondary | ICD-10-CM

## 2020-06-14 ENCOUNTER — Other Ambulatory Visit: Payer: Self-pay | Admitting: Family Medicine

## 2020-06-14 NOTE — Telephone Encounter (Signed)
Last filled 05/12/20 

## 2020-06-14 NOTE — Telephone Encounter (Signed)
She was supposed to be seen on 06/09/20, 4 weeks follow up. Thanks, BJ

## 2020-06-16 ENCOUNTER — Telehealth (INDEPENDENT_AMBULATORY_CARE_PROVIDER_SITE_OTHER): Payer: 59 | Admitting: Family Medicine

## 2020-06-16 ENCOUNTER — Encounter: Payer: Self-pay | Admitting: Family Medicine

## 2020-06-16 VITALS — Ht 64.0 in

## 2020-06-16 DIAGNOSIS — F330 Major depressive disorder, recurrent, mild: Secondary | ICD-10-CM | POA: Diagnosis not present

## 2020-06-16 DIAGNOSIS — G4700 Insomnia, unspecified: Secondary | ICD-10-CM | POA: Diagnosis not present

## 2020-06-16 DIAGNOSIS — K219 Gastro-esophageal reflux disease without esophagitis: Secondary | ICD-10-CM

## 2020-06-16 DIAGNOSIS — F902 Attention-deficit hyperactivity disorder, combined type: Secondary | ICD-10-CM | POA: Diagnosis not present

## 2020-06-16 MED ORDER — AMPHETAMINE-DEXTROAMPHET ER 20 MG PO CP24: 20.0000 mg | ORAL_CAPSULE | ORAL | 0 refills | Status: DC

## 2020-06-16 MED ORDER — BUPROPION HCL ER (SR) 100 MG PO TB12: 100.0000 mg | ORAL_TABLET | Freq: Two times a day (BID) | ORAL | 1 refills | Status: DC

## 2020-06-16 NOTE — Assessment & Plan Note (Signed)
Problem improved when treating depression. Good sleep hygiene. She was instructed to le me know if problem re-curs.

## 2020-06-16 NOTE — Assessment & Plan Note (Signed)
Improved. Continue non pharmacologic treatment and GERD precautions. F/U as needed.

## 2020-06-16 NOTE — Progress Notes (Signed)
Virtual Visit via Video Note I connected with Sarah Whitney on 06/16/20 by a video enabled telemedicine application and verified that I am speaking with the correct person using two identifiers.  Location patient: home Location provider:work office Persons participating in the virtual visit: patient, provider  I discussed the limitations of evaluation and management by telemedicine and the availability of in person appointments. The patient expressed understanding and agreed to proceed.  Chief Complaint  Patient presents with  . Medication Refill   HPI: Sarah Whitney is a 30 year old following on some of her chronic medical problems.  She was last seen on 05/12/20. ADHD: She continue with Adderall XR 20 mg daily. She has tolerated medication well.  Last visit Wellbutrin SR 100 mg 2 tab twice daily was added.  She has noted great improvement in mood. She does not get upset as easily as she did.  Dealing with stress much better.  Concentration has also improved since Wellbutrin was added. Not easily distracted and able to complete chores efficiently.  Initially she has some "fast" heart rate but resolved after a week of taking medication.  She is also sleeping better, at least 7 hours.  Last visit she was reporting nausea and intermittent episodes of heartburn.  Omeprazole 40 mg was recommended. Symptoms have resolved, she is not longer taking omeprazole. She changed dietary habits, stop eating late at night. Negative for abdominal pain, changes in bowel habits, vomiting, or melena.  ROS: See pertinent positives and negatives per HPI.  Past Medical History:  Diagnosis Date  . Allergy   . Anxiety   . Depression   . GERD (gastroesophageal reflux disease)    History reviewed. No pertinent surgical history.  History reviewed. No pertinent family history.  Social History   Socioeconomic History  . Marital status: Married    Spouse name: Not on file  . Number of children: Not on  file  . Years of education: Not on file  . Highest education level: Not on file  Occupational History  . Not on file  Tobacco Use  . Smoking status: Never Smoker  . Smokeless tobacco: Never Used  Substance and Sexual Activity  . Alcohol use: Not on file  . Drug use: Not on file  . Sexual activity: Not on file  Other Topics Concern  . Not on file  Social History Narrative  . Not on file   Social Determinants of Health   Financial Resource Strain: Not on file  Food Insecurity: Not on file  Transportation Needs: Not on file  Physical Activity: Not on file  Stress: Not on file  Social Connections: Not on file  Intimate Partner Violence: Not on file    Current Outpatient Medications:  .  albuterol (VENTOLIN HFA) 108 (90 Base) MCG/ACT inhaler, TAKE 2 PUFFS BY MOUTH EVERY 6 HOURS AS NEEDED FOR WHEEZE OR SHORTNESS OF BREATH, Disp: 8.5 each, Rfl: 2 .  budesonide-formoterol (SYMBICORT) 160-4.5 MCG/ACT inhaler, Inhale 2 puffs into the lungs 2 (two) times daily., Disp: 1 Inhaler, Rfl: 3 .  levothyroxine (SYNTHROID) 50 MCG tablet, TAKE 1 TABLET BY MOUTH EVERY DAY BEFORE BREAKFAST, Disp: 30 tablet, Rfl: 2 .  amphetamine-dextroamphetamine (ADDERALL XR) 20 MG 24 hr capsule, Take 1 capsule (20 mg total) by mouth every morning., Disp: 30 capsule, Rfl: 0 .  buPROPion (WELLBUTRIN SR) 100 MG 12 hr tablet, Take 1 tablet (100 mg total) by mouth 2 (two) times daily., Disp: 180 tablet, Rfl: 1 .  omeprazole (PRILOSEC) 40 MG capsule, Take  1 capsule (40 mg total) by mouth daily., Disp: 90 capsule, Rfl: 1  EXAM:  VITALS per patient if applicable:Ht 5\' 4"  (1.626 m)   BMI 43.29 kg/m   GENERAL: alert, oriented, appears well and in no acute distress  HEENT: atraumatic, conjunctiva clear, no obvious abnormalities on inspection.  NECK: normal movements of the head and neck  LUNGS: on inspection no signs of respiratory distress, breathing rate appears normal, no obvious gross SOB, gasping or  wheezing  CV: no obvious cyanosis  MS: moves all visible extremities without noticeable abnormality  PSYCH/NEURO: pleasant and cooperative, no obvious depression or anxiety, speech and thought processing grossly intact  ASSESSMENT AND PLAN:  Discussed the following assessment and plan:  Insomnia disorder Problem improved when treating depression. Good sleep hygiene. She was instructed to le me know if problem re-curs.  Attention deficit hyperactivity disorder (ADHD), combined type Problem has been well controlled. Continue Adderall XR 20 mg daily. We discussed some side effects of medication. Medication contract signed on 05/12/2020. PDMP reviewed. Rx's x3 sent.  Depression, major, recurrent, mild (HCC) Improved. Continue Wellbutrin SR 100 mg twice daily. Still has not established with psychiatrist nor psychotherapist.  Gastroesophageal reflux disease without esophagitis Improved. Continue non pharmacologic treatment and GERD precautions. F/U as needed.   I discussed the assessment and treatment plan with the patient. Sarah Whitney was provided an opportunity to ask questions and all were answered. She agreed with the plan and demonstrated an understanding of the instructions.   Return in about 15 weeks (around 09/29/2020) for ADHD and depression.  Brynli Ollis 11/29/2020, MD

## 2020-06-16 NOTE — Assessment & Plan Note (Signed)
Improved. Continue Wellbutrin SR 100 mg twice daily. Still has not established with psychiatrist nor psychotherapist.

## 2020-06-16 NOTE — Assessment & Plan Note (Signed)
Problem has been well controlled. Continue Adderall XR 20 mg daily. We discussed some side effects of medication. Medication contract signed on 05/12/2020. PDMP reviewed. Rx's x3 sent.

## 2020-06-23 NOTE — Progress Notes (Signed)
Patient is scheduled 09/22/2020 at 11:30 AM

## 2020-08-13 ENCOUNTER — Other Ambulatory Visit: Payer: Self-pay | Admitting: Family Medicine

## 2020-08-13 DIAGNOSIS — F902 Attention-deficit hyperactivity disorder, combined type: Secondary | ICD-10-CM

## 2020-08-14 NOTE — Telephone Encounter (Signed)
Rx last filled 07/17/20, called pharmacy, they only received 2 Rx's back in January.

## 2020-08-15 NOTE — Telephone Encounter (Signed)
An you advise in pcp's absence?

## 2020-08-16 MED ORDER — AMPHETAMINE-DEXTROAMPHET ER 20 MG PO CP24: 20.0000 mg | ORAL_CAPSULE | ORAL | 0 refills | Status: DC

## 2020-08-16 NOTE — Telephone Encounter (Signed)
Medication refilled once in Dr Elvis Coil absence.

## 2020-09-04 ENCOUNTER — Other Ambulatory Visit: Payer: Self-pay | Admitting: Family Medicine

## 2020-09-12 ENCOUNTER — Other Ambulatory Visit: Payer: Self-pay | Admitting: Family Medicine

## 2020-09-18 ENCOUNTER — Other Ambulatory Visit: Payer: Self-pay | Admitting: Family Medicine

## 2020-09-18 DIAGNOSIS — F902 Attention-deficit hyperactivity disorder, combined type: Secondary | ICD-10-CM

## 2020-09-18 NOTE — Telephone Encounter (Signed)
Last filled 08/16/20 Has upcoming appt on 09/22/20

## 2020-09-19 MED ORDER — AMPHETAMINE-DEXTROAMPHET ER 20 MG PO CP24: 20.0000 mg | ORAL_CAPSULE | ORAL | 0 refills | Status: DC

## 2020-09-22 ENCOUNTER — Ambulatory Visit: Payer: 59 | Admitting: Family Medicine

## 2020-09-22 DIAGNOSIS — Z0289 Encounter for other administrative examinations: Secondary | ICD-10-CM

## 2020-10-16 ENCOUNTER — Other Ambulatory Visit: Payer: Self-pay | Admitting: Family Medicine

## 2020-12-08 ENCOUNTER — Telehealth: Payer: Self-pay | Admitting: Family Medicine

## 2020-12-08 MED ORDER — ALBUTEROL SULFATE HFA 108 (90 BASE) MCG/ACT IN AERS: INHALATION_SPRAY | RESPIRATORY_TRACT | 2 refills | Status: DC

## 2020-12-08 NOTE — Telephone Encounter (Signed)
Pt is calling in to see if she can get a refill on her Rx albuterol (VENTOLIN HFA) 108 MCg she is having issues with breathing and has had to use it more often then usual due to having COVID.   Pharm:  [facility] on [address] would like to see if it can be called in today.

## 2020-12-08 NOTE — Telephone Encounter (Signed)
Rx sent in

## 2021-01-15 ENCOUNTER — Other Ambulatory Visit: Payer: Self-pay

## 2021-01-15 DIAGNOSIS — F902 Attention-deficit hyperactivity disorder, combined type: Secondary | ICD-10-CM

## 2021-01-15 MED ORDER — LEVOTHYROXINE SODIUM 50 MCG PO TABS: ORAL_TABLET | ORAL | 1 refills | Status: DC

## 2021-01-15 NOTE — Telephone Encounter (Signed)
Last Refill: 09/19/2020 Last Office Visit: 06/16/20 Next Office Visit: 02/06/21 Med Contract:

## 2021-01-15 NOTE — Addendum Note (Signed)
Addended by: Kathreen Devoid on: 01/15/2021 12:42 PM   Modules accepted: Orders

## 2021-01-15 NOTE — Telephone Encounter (Signed)
Patient called requesting Rx refill on levothyroxine (SYNTHROID) 50 MCG tablet amphetamine-dextroamphetamine (ADDERALL XR) 20 MG 24 hr capsule Patient has upcoming Appt

## 2021-01-24 ENCOUNTER — Other Ambulatory Visit: Payer: Self-pay | Admitting: Family Medicine

## 2021-01-24 DIAGNOSIS — F902 Attention-deficit hyperactivity disorder, combined type: Secondary | ICD-10-CM

## 2021-02-06 ENCOUNTER — Ambulatory Visit: Payer: BC Managed Care – PPO | Admitting: Family Medicine

## 2021-02-06 ENCOUNTER — Other Ambulatory Visit: Payer: Self-pay

## 2021-02-06 ENCOUNTER — Encounter: Payer: Self-pay | Admitting: Family Medicine

## 2021-02-06 VITALS — BP 128/80 | HR 88 | Resp 16 | Ht 64.0 in | Wt 231.5 lb

## 2021-02-06 DIAGNOSIS — F330 Major depressive disorder, recurrent, mild: Secondary | ICD-10-CM | POA: Diagnosis not present

## 2021-02-06 DIAGNOSIS — F902 Attention-deficit hyperactivity disorder, combined type: Secondary | ICD-10-CM

## 2021-02-06 DIAGNOSIS — F411 Generalized anxiety disorder: Secondary | ICD-10-CM

## 2021-02-06 DIAGNOSIS — E039 Hypothyroidism, unspecified: Secondary | ICD-10-CM | POA: Diagnosis not present

## 2021-02-06 MED ORDER — AMPHETAMINE-DEXTROAMPHET ER 20 MG PO CP24: 20.0000 mg | ORAL_CAPSULE | ORAL | 0 refills | Status: DC

## 2021-02-06 NOTE — Assessment & Plan Note (Addendum)
Improved some, she is not interested in pharmacologic treatment at this time. CBT recommended, contact information given/flyer.

## 2021-02-06 NOTE — Progress Notes (Signed)
HPI: [name] is a 30 y.o. female, who is here today for follow up.   She was last seen on 06/16/20.  ADHD:Dx 11/2019. She has been on Adderall XR. Prescription last filled on 09/19/20. She did not have health insurance. Medication did not affect sleep,caused tremor or tics.  She has not taking Adderall XR 20 mg for about 2-3 months. She takes medication around 6 Am and around 2-3 pm she feels like medications has weaned off, wonders if IR dose can be added in the afternoon. She is back home at 3 pm and needs to complete house chores.  She has tried Vyvanse and Methylphenidate in the past. She did not go to [employer] [employer], as she was planning, because cost.  Anxiety and depression: Dx'ed when she was in high school. She does not feel like Adderall aggravated anxiety.  She has not taken Wellbutrin. She felt like it was causing hair loss and irritability.  Sleeping about 4 hours at night because work and family. She is now working, recently promoted to general management position. Husband works at night.  Depression screen [facility] 2/9 02/06/2021 02/28/2020 12/10/2019  Decreased Interest 1 1 0  Down, Depressed, Hopeless 1 1 0  PHQ - 2 Score 2 2 0  Altered sleeping 0 3 -  Tired, decreased energy 1 0 -  Change in appetite 1 1 -  Feeling bad or failure about yourself  1 1 -  Trouble concentrating 1 1 -  Moving slowly or fidgety/restless 2 2 -  Suicidal thoughts 0 0 -  PHQ-9 Score 8 10 -  Difficult doing work/chores Somewhat difficult Somewhat difficult -   GAD 7 : Generalized Anxiety Score 02/06/2021 02/28/2020  Nervous, Anxious, on Edge 2 2  Control/stop worrying 2 2  Worry too much - different things 2 3  Trouble relaxing 2 2  Restless 1 1  Easily annoyed or irritable 2 2  Afraid - awful might happen 2 2  Total GAD 7 Score 13 14  Anxiety Difficulty Somewhat difficult Very difficult   Hypothyroidism: She has not taken Levothyroxine 50 mcg for 2-3  months.  Lab Results  Component Value Date   TSH 3.59 02/18/2020   She is not exercising regularly. She has not made changes in her diet dince her last visit. States that Adderall was helping with appetite.  Review of Systems  Constitutional:  Positive for fatigue. Negative for activity change, appetite change and fever.  HENT:  Negative for mouth sores, nosebleeds and sore throat.   Respiratory:  Negative for cough and wheezing.   Cardiovascular:  Negative for chest pain and palpitations.  Gastrointestinal:  Negative for abdominal pain, nausea and vomiting.       Negative for changes in bowel habits.  Endocrine: Negative for cold intolerance, heat intolerance, polydipsia, polyphagia and polyuria.  Musculoskeletal:  Negative for gait problem and myalgias.  Neurological:  Negative for syncope, weakness and headaches.  Psychiatric/Behavioral:  Negative for confusion and hallucinations. The patient is nervous/anxious.   Rest of ROS, see pertinent positives sand negatives in HPI  Current Outpatient Medications on File Prior to Visit  Medication Sig Dispense Refill   albuterol (VENTOLIN HFA) 108 (90 Base) MCG/ACT inhaler TAKE 2 PUFFS BY MOUTH EVERY 6 HOURS AS NEEDED FOR WHEEZE OR SHORTNESS OF BREATH 8.5 each 2   No current facility-administered medications on file prior to visit.     Past Medical History:  Diagnosis Date   Allergy  Anxiety    Depression    GERD (gastroesophageal reflux disease)    No Known Allergies  Social History   Socioeconomic History   Marital status: Married    Spouse name: Not on file   Number of children: Not on file   Years of education: Not on file   Highest education level: Not on file  Occupational History   Not on file  Tobacco Use   Smoking status: Never   Smokeless tobacco: Never  Substance and Sexual Activity   Alcohol use: Not on file   Drug use: Not on file   Sexual activity: Not on file  Other Topics Concern   Not on file   Social History Narrative   Not on file   Social Determinants of Health   Financial Resource Strain: Not on file  Food Insecurity: Not on file  Transportation Needs: Not on file  Physical Activity: Not on file  Stress: Not on file  Social Connections: Not on file   Vitals:   02/06/21 1425  BP: 128/80  Pulse: 88  Resp: 16  SpO2: 98%   Wt Readings from Last 3 Encounters:  02/06/21 231 lb 8 oz (105 kg)  05/12/20 252 lb 3.2 oz (114.4 kg)  02/28/20 264 lb (119.7 kg)   Body mass index is 39.74 kg/m.  Physical Exam Vitals and nursing note reviewed.  Constitutional:      General: She is not in acute distress.    Appearance: She is well-developed.  HENT:     Head: Normocephalic and atraumatic.  Eyes:     Conjunctiva/sclera: Conjunctivae normal.  Cardiovascular:     Rate and Rhythm: Normal rate and regular rhythm.     Heart sounds: No murmur heard. Pulmonary:     Effort: Pulmonary effort is normal. No respiratory distress.     Breath sounds: Normal breath sounds.  Skin:    General: Skin is warm.     Findings: No erythema or rash.  Neurological:     General: No focal deficit present.     Mental Status: She is alert and oriented to person, place, and time.     Gait: Gait normal.  Psychiatric:        Mood and Affect: Mood is anxious.        Thought Content: Thought content does not include suicidal ideation. Thought content does not include suicidal plan.   ASSESSMENT AND PLAN:  [name] was seen today for follow-up.  Attention deficit hyperactivity disorder (ADHD), combined type Adderall XR 20 mg was helping, so resume. I do not recommend adding IR Adderall in the afternoon, recommend trying to manage time at home with non pharmacologic strategies, CBT may help with this. Some side effects of medication discussed. [place] controlled sibs report reviewed. Medication contract is current. F/U in 4 months, before if needed.   Hypothyroidism Resume Levothyroxine  50 mcg daily. Will plan on checking TSH next visit.  Depression, major, recurrent, mild (HCC) Improved some, she is not interested in pharmacologic treatment at this time. CBT recommended, contact information given/flyer.  GAD (generalized anxiety disorder) Problem is not well controlled. She is not interested in pharmacologic treatment at this time, attributes symptoms to ADHD and stress at work. CBT recommended.  Morbid obesity (HCC) She has lost wt since her last office visit. She understand the benefits of wt loss as well as adverse effects of obesity. Consistency with healthy diet and physical activity encouraged.  Return in about 4 months (around  06/08/2021).   Sarah Whitney G. [place], MD  [facility]. [place] office.

## 2021-02-06 NOTE — Assessment & Plan Note (Signed)
Problem is not well controlled. She is not interested in pharmacologic treatment at this time, attributes symptoms to ADHD and stress at work. CBT recommended.

## 2021-02-06 NOTE — Assessment & Plan Note (Signed)
Adderall XR 20 mg was helping, so resume. I do not recommend adding IR Adderall in the afternoon, recommend trying to manage time at home with non pharmacologic strategies, CBT may help with this. Some side effects of medication discussed. [place] controlled sibs report reviewed. Medication contract is current. F/U in 4 months, before if needed.

## 2021-02-06 NOTE — Patient Instructions (Addendum)
A few things to remember from today's visit:  Depression, major, recurrent, mild (HCC)  Attention deficit hyperactivity disorder (ADHD), combined type - Plan: amphetamine-dextroamphetamine (ADDERALL XR) 20 MG 24 hr capsule  Hypothyroidism, unspecified type  Resume same dose of Adderall XR same dose. Living With Attention Deficit Hyperactivity Disorder If you have been diagnosed with attention deficit hyperactivity disorder (ADHD), you may be relieved that you now know why you have felt or behaved a certain way. Still, you may feel overwhelmed about the treatment ahead. You may also wonder how to get the support you need and how to deal with the condition day-to-day. With treatment and support, you can live with ADHD and manage your symptoms. How to manage lifestyle changes Managing stress Stress is your body's reaction to life changes and events, both good and bad. To cope with the stress of an ADHD diagnosis, it may help to: Learn more about ADHD. Exercise regularly. Even a short daily walk can lower stress levels. Participate in training or education programs (including social skills training classes) that teach you to deal with symptoms.  Medicines Your health care provider may suggest certain medicines if he or she feels that they will help to improve your condition. Stimulant medicines are usually prescribed to treat ADHD, and therapy may also be prescribed. It is important to: Avoid using alcohol and other substances that may prevent your medicines from working properly (may interact). Talk with your pharmacist or health care provider about all the medicines that you take, their possible side effects, and what medicines are safe to take together. Make it your goal to take part in all treatment decisions (shared decision-making). Ask about possible side effects of medicines that your health care provider recommends, and tell him or her how you feel about having those side effects. It is  best if shared decision-making with your health care provider is part of your total treatment plan. Relationships To strengthen your relationships with family members while treating your condition, consider taking part in family therapy. You might also attend self-help groups alone or with a loved one. Be honest about how your symptoms affect your relationships. Make an effort to communicate respectfully instead of fighting, and find ways to show others that you care. Psychotherapy may be useful in helping you cope with how ADHD affects your relationships. How to recognize changes in your condition The following signs may mean that your treatment is working well and your condition is improving: Consistently being on time for appointments. Being more organized at home and work. Other people noticing improvements in your behavior. Achieving goals that you set for yourself. Thinking more clearly. The following signs may mean that your treatment is not working very well: Feeling impatience or more confusion. Missing, forgetting, or being late for appointments. An increasing sense of disorganization and messiness. More difficulty in reaching goals that you set for yourself. Loved ones becoming angry or frustrated with you. Follow these instructions at home: Take over-the-counter and prescription medicines only as told by your health care provider. Check with your health care provider before taking any new medicines. Create structure and an organized atmosphere at home. For example: Make a list of tasks, then rank them from most important to least important. Work on one task at a time until your listed tasks are done. Make a daily schedule and follow it consistently every day. Use an appointment calendar, and check it 2 or 3 times a day to keep on track. Keep it with you  when you leave the house. Create spaces where you keep certain things, and always put things back in their places after you use  them. Keep all follow-up visits as told by your health care provider. This is important. Where to find support Talking to others  Keep emotion out of important discussions and speak in a calm, logical way. Listen closely and patiently to your loved ones. Try to understand their point of view, and try to avoid getting defensive. Take responsibility for the consequences of your actions. Ask that others do not take your behaviors personally. Aim to solve problems as they come up, and express your feelings instead of bottling them up. Talk openly about what you need from your loved ones and how they can support you. Consider going to family therapy sessions or having your family meet with a specialist who deals with ADHD-related behavior problems. Finances Not all insurance plans cover mental health care, so it is important to check with your insurance carrier. If paying for co-pays or counseling services is a problem, search for a local or county mental health care center. Public mental health care services may be offered there at a low cost or no cost when you are not able to see a private health care provider. If you are taking medicine for ADHD, you may be able to get the generic form, which may be less expensive than brand-name medicine. Some makers of prescription medicines also offer help to patients who cannot afford the medicines that they need. Questions to ask your health care provider: What are the risks and benefits of taking medicines? Would I benefit from therapy? How often should I follow up with a health care provider? Contact a health care provider if: You have side effects from your medicines, such as: Repeated muscle twitches, coughing, or speech outbursts. Sleep problems. Loss of appetite. Depression. New or worsening behavior problems. Dizziness. Unusually fast heartbeat. Stomach pains. Headaches. Get help right away if: You have a severe reaction to a medicine. Your  behavior suddenly gets worse. Summary With treatment and support, you can live with ADHD and manage your symptoms. The medicines that are most often prescribed for ADHD are stimulants. Consider taking part in family therapy or self-help groups with family members or friends. When you talk with friends and family about your ADHD, be patient and communicate openly. Take over-the-counter and prescription medicines only as told by your health care provider. Check with your health care provider before taking any new medicines. This information is not intended to replace advice given to you by your health care provider. Make sure you discuss any questions you have with your health care provider. Document Revised: 10/27/2019 Document Reviewed: 10/27/2019 Elsevier Patient Education  2022 [employer].  If you need refills please call your pharmacy. Do not use My Chart to request refills or for acute issues that need immediate attention.    Please be sure medication list is accurate. If a new problem present, please set up appointment sooner than planned today.

## 2021-02-06 NOTE — Assessment & Plan Note (Signed)
Resume Levothyroxine 50 mcg daily. Will plan on checking TSH next visit.

## 2021-02-09 ENCOUNTER — Telehealth: Payer: Self-pay

## 2021-02-09 MED ORDER — LEVOTHYROXINE SODIUM 50 MCG PO TABS: ORAL_TABLET | ORAL | 1 refills | Status: DC

## 2021-02-09 NOTE — Telephone Encounter (Signed)
I spoke with pharmacy, the Adderall is ready to be picked up. Rx sent in for the Levothyroxine.

## 2021-02-09 NOTE — Addendum Note (Signed)
Addended by: Kathreen Devoid on: 02/09/2021 09:21 AM   Modules accepted: Orders

## 2021-02-09 NOTE — Telephone Encounter (Signed)
Patient called stating Rx was not sent to the pharmacy levothyroxine (SYNTHROID) 50 MCG tablet amphetamine-dextroamphetamine (ADDERALL XR) 20 MG 24 hr capsule [id number] - [place], [place] - [address]

## 2021-03-08 ENCOUNTER — Other Ambulatory Visit: Payer: Self-pay | Admitting: Family Medicine

## 2021-03-08 DIAGNOSIS — F902 Attention-deficit hyperactivity disorder, combined type: Secondary | ICD-10-CM

## 2021-03-12 MED ORDER — AMPHETAMINE-DEXTROAMPHET ER 20 MG PO CP24: 20.0000 mg | ORAL_CAPSULE | ORAL | 0 refills | Status: DC

## 2021-03-12 NOTE — Telephone Encounter (Signed)
Okay for refill?    LOV 02/06/2021   Last Refill  02/06/2021   30 QTY.   0 Refills

## 2021-03-19 ENCOUNTER — Other Ambulatory Visit: Payer: Self-pay | Admitting: Family Medicine

## 2021-04-13 ENCOUNTER — Other Ambulatory Visit: Payer: Self-pay | Admitting: Family Medicine

## 2021-04-13 DIAGNOSIS — F902 Attention-deficit hyperactivity disorder, combined type: Secondary | ICD-10-CM

## 2021-04-13 NOTE — Telephone Encounter (Signed)
-   last filled 03/12/21 - last office visit 02/06/21 - next office visit 06/08/21 - med contract on file (due to be updated in 05/2021.)

## 2021-04-15 ENCOUNTER — Other Ambulatory Visit: Payer: Self-pay | Admitting: Family Medicine

## 2021-04-15 MED ORDER — AMPHETAMINE-DEXTROAMPHET ER 20 MG PO CP24: 20.0000 mg | ORAL_CAPSULE | ORAL | 0 refills | Status: DC

## 2021-05-11 ENCOUNTER — Other Ambulatory Visit: Payer: Self-pay | Admitting: Family Medicine

## 2021-05-19 ENCOUNTER — Other Ambulatory Visit: Payer: Self-pay | Admitting: Family Medicine

## 2021-05-19 DIAGNOSIS — F902 Attention-deficit hyperactivity disorder, combined type: Secondary | ICD-10-CM

## 2021-05-23 ENCOUNTER — Emergency Department (HOSPITAL_COMMUNITY): Payer: BC Managed Care – PPO

## 2021-05-23 ENCOUNTER — Other Ambulatory Visit: Payer: Self-pay

## 2021-05-23 ENCOUNTER — Encounter (HOSPITAL_COMMUNITY): Payer: Self-pay

## 2021-05-23 ENCOUNTER — Emergency Department (HOSPITAL_COMMUNITY)
Admission: EM | Admit: 2021-05-23 | Discharge: 2021-05-23 | Disposition: A | Discharge status: Home / Self Care | Payer: BC Managed Care – PPO | Attending: Emergency Medicine | Admitting: Emergency Medicine

## 2021-05-23 ENCOUNTER — Encounter (HOSPITAL_COMMUNITY): Payer: Self-pay | Admitting: Emergency Medicine

## 2021-05-23 DIAGNOSIS — R110 Nausea: Secondary | ICD-10-CM | POA: Diagnosis not present

## 2021-05-23 DIAGNOSIS — R0789 Other chest pain: Secondary | ICD-10-CM | POA: Insufficient documentation

## 2021-05-23 DIAGNOSIS — N9489 Other specified conditions associated with female genital organs and menstrual cycle: Secondary | ICD-10-CM | POA: Diagnosis not present

## 2021-05-23 DIAGNOSIS — O99891 Other specified diseases and conditions complicating pregnancy: Secondary | ICD-10-CM | POA: Diagnosis not present

## 2021-05-23 DIAGNOSIS — J4540 Moderate persistent asthma, uncomplicated: Secondary | ICD-10-CM | POA: Insufficient documentation

## 2021-05-23 DIAGNOSIS — O26891 Other specified pregnancy related conditions, first trimester: Secondary | ICD-10-CM | POA: Insufficient documentation

## 2021-05-23 DIAGNOSIS — E039 Hypothyroidism, unspecified: Secondary | ICD-10-CM | POA: Insufficient documentation

## 2021-05-23 DIAGNOSIS — R079 Chest pain, unspecified: Secondary | ICD-10-CM | POA: Diagnosis not present

## 2021-05-23 DIAGNOSIS — I10 Essential (primary) hypertension: Secondary | ICD-10-CM | POA: Diagnosis not present

## 2021-05-23 DIAGNOSIS — Z79899 Other long term (current) drug therapy: Secondary | ICD-10-CM | POA: Insufficient documentation

## 2021-05-23 DIAGNOSIS — Z3A10 10 weeks gestation of pregnancy: Secondary | ICD-10-CM | POA: Diagnosis not present

## 2021-05-23 LAB — CBC WITH DIFFERENTIAL/PLATELET
Abs Immature Granulocytes: 0.07 10*3/uL (ref 0.00–0.07)
Basophils Absolute: 0.1 10*3/uL (ref 0.0–0.1)
Basophils Relative: 1 %
Eosinophils Absolute: 0.3 10*3/uL (ref 0.0–0.5)
Eosinophils Relative: 3 %
HCT: 39.8 % (ref 36.0–46.0)
Hemoglobin: 13.3 g/dL (ref 12.0–15.0)
Immature Granulocytes: 1 %
Lymphocytes Relative: 23 %
Lymphs Abs: 2.7 10*3/uL (ref 0.7–4.0)
MCH: 28.5 pg (ref 26.0–34.0)
MCHC: 33.4 g/dL (ref 30.0–36.0)
MCV: 85.2 fL (ref 80.0–100.0)
Monocytes Absolute: 0.5 10*3/uL (ref 0.1–1.0)
Monocytes Relative: 5 %
Neutro Abs: 8 10*3/uL — ABNORMAL HIGH (ref 1.7–7.7)
Neutrophils Relative %: 67 %
Platelets: 258 10*3/uL (ref 150–400)
RBC: 4.67 MIL/uL (ref 3.87–5.11)
RDW: 12.6 % (ref 11.5–15.5)
WBC: 11.7 10*3/uL — ABNORMAL HIGH (ref 4.0–10.5)
nRBC: 0 % (ref 0.0–0.2)

## 2021-05-23 LAB — URINALYSIS, ROUTINE W REFLEX MICROSCOPIC
Bilirubin Urine: NEGATIVE
Glucose, UA: NEGATIVE mg/dL
Hgb urine dipstick: NEGATIVE
Ketones, ur: NEGATIVE mg/dL
Nitrite: NEGATIVE
Protein, ur: NEGATIVE mg/dL
Specific Gravity, Urine: 1.009 (ref 1.005–1.030)
pH: 8 (ref 5.0–8.0)

## 2021-05-23 LAB — BASIC METABOLIC PANEL
Anion gap: 9 (ref 5–15)
BUN: 10 mg/dL (ref 6–20)
CO2: 21 mmol/L — ABNORMAL LOW (ref 22–32)
Calcium: 8.7 mg/dL — ABNORMAL LOW (ref 8.9–10.3)
Chloride: 107 mmol/L (ref 98–111)
Creatinine, Ser: 1.03 mg/dL — ABNORMAL HIGH (ref 0.44–1.00)
GFR, Estimated: >60 mL/min (ref >60)
Glucose, Bld: 126 mg/dL — ABNORMAL HIGH (ref 70–99)
Potassium: 3.6 mmol/L (ref 3.5–5.1)
Sodium: 137 mmol/L (ref 135–145)

## 2021-05-23 LAB — TROPONIN I (HIGH SENSITIVITY)
Troponin I (High Sensitivity): 2 ng/L (ref <18)
Troponin I (High Sensitivity): 3 ng/L (ref <18)

## 2021-05-23 LAB — PROTEIN / CREATININE RATIO, URINE
Creatinine, Urine: 53.3 mg/dL
Protein Creatinine Ratio: 0.11 mg/mg{Cre} (ref 0.00–0.15)
Total Protein, Urine: 6 mg/dL

## 2021-05-23 LAB — D-DIMER, QUANTITATIVE: D-Dimer, Quant: 1.27 ug/mL-FEU — ABNORMAL HIGH (ref 0.00–0.50)

## 2021-05-23 LAB — HCG, QUANTITATIVE, PREGNANCY: hCG, Beta Chain, Quant, S: 117924 m[IU]/mL — ABNORMAL HIGH (ref <5)

## 2021-05-23 MED ORDER — IOHEXOL 350 MG/ML SOLN
100.0000 mL | Freq: Once | INTRAVENOUS | Status: AC | PRN
  Administered 2021-05-23: 14:00:00 100 mL via INTRAVENOUS

## 2021-05-23 NOTE — ED Triage Notes (Signed)
Patient arrived with EMS from home , she is [redacted] weeks pregnant G3P1, reports left chest pain this evening radiating to left inner arm and scapula with mild SOB , nausea and lightheaded.

## 2021-05-23 NOTE — ED Notes (Signed)
Called [facility] to add culture on they said yes

## 2021-05-23 NOTE — ED Provider Notes (Signed)
[facility] EMERGENCY DEPARTMENT Provider Note   CSN: [id number] Arrival date & time: 05/23/21  0044     History Chief Complaint  Patient presents with   Chest Pain    [redacted] Weeks Pregnant   Hypertension    Sarah Whitney is a 30 y.o. female with a past medical history significant for anxiety, depression, GERD, and hypothyroidism who presents to the ED due to left-sided, sharp, stabbing chest pain that has been intermittent for the past 2 days.  Chest pain radiates down left arm to back.  Patient states chest pain started when lifting her 79-year-old. Yesterday pain occurred randomly without any lifting and became more constant.  Patient states pain was worse with deep inspiration.  Patient is currently chest pain-free.  Last episode of chest pain was 3 AM this morning.  Chest pain associated with nausea and fatigue.  Patient is currently [redacted]w[redacted]d gestation. No prenatal care yet. LMP 10/16.  Denies vaginal bleeding, vaginal discharge, and fluid from the vagina.  No cardiac history.  Patient had blood pressure issues during previous pregnancies however, never been diagnosed with preeclampsia.  No previous DVT/PE.  Denies lower extremity edema.  No shortness of breath.    History obtained from patient and past medical records. No interpreter used during encounter.       Past Medical History:  Diagnosis Date   Allergy    Anxiety    Depression    GERD (gastroesophageal reflux disease)     Patient Active Problem List   Diagnosis Date Noted   GAD (generalized anxiety disorder) 02/06/2021   Gastroesophageal reflux disease without esophagitis 05/12/2020   Depression, major, recurrent, mild (HCC) 05/12/2020   Attention deficit hyperactivity disorder (ADHD), combined type 05/12/2020   Hypothyroidism 12/31/2019   Insomnia disorder 12/10/2019   Moderate intermittent asthma 12/10/2019   Morbid obesity (HCC) 12/10/2019    History reviewed. No pertinent surgical history.   OB  History     Gravida  1   Para      Term      Preterm      AB      Living         SAB      IAB      Ectopic      Multiple      Live Births              No family history on file.  Social History   Tobacco Use   Smoking status: Never   Smokeless tobacco: Never    Home Medications Prior to Admission medications   Medication Sig Start Date End Date Taking? Authorizing Provider  albuterol (VENTOLIN HFA) 108 (90 Base) MCG/ACT inhaler TAKE 2 PUFFS BY MOUTH EVERY 6 HOURS AS NEEDED FOR WHEEZE OR SHORTNESS OF BREATH 05/11/21   [place], Betty G, MD  amphetamine-dextroamphetamine (ADDERALL XR) 20 MG 24 hr capsule Take 1 capsule (20 mg total) by mouth every morning. 04/15/21   [place], Betty G, MD  amphetamine-dextroamphetamine (ADDERALL XR) 20 MG 24 hr capsule Take 1 capsule (20 mg total) by mouth every morning. 04/15/21   [place], Betty G, MD  levothyroxine (SYNTHROID) 50 MCG tablet TAKE 1 TABLET BY MOUTH EVERY DAY BEFORE BREAKFAST 02/09/21   [place], Betty G, MD    Allergies    Patient has no known allergies.  Review of Systems   Review of Systems  Constitutional:  Positive for fatigue.  Respiratory:  Negative for shortness of breath.   Cardiovascular:  Positive for chest pain. Negative for leg swelling.  Gastrointestinal:  Positive for nausea.  All other systems reviewed and are negative.  Physical Exam Updated Vital Signs BP 128/76 (BP Location: Left Arm)    Pulse 65    Temp 98.1 F (36.7 C) (Oral)    Resp (!) 24    Ht 5\' 4"  (1.626 m)    Wt 110 kg    LMP 03/11/2021    SpO2 98%    BMI 41.63 kg/m   Physical Exam Vitals and nursing note reviewed.  Constitutional:      General: She is not in acute distress.    Appearance: She is not ill-appearing.  HENT:     Head: Normocephalic.  Eyes:     Pupils: Pupils are equal, round, and reactive to light.  Cardiovascular:     Rate and Rhythm: Normal rate and regular rhythm.     Pulses: Normal pulses.     Heart  sounds: Normal heart sounds. No murmur heard.   No friction rub. No gallop.  Pulmonary:     Effort: Pulmonary effort is normal.     Breath sounds: Normal breath sounds.  Chest:     Comments: Tenderness throughout left side of anterior chest wall.  Patient notes tenderness is not the same pain she is feeling. Abdominal:     General: Abdomen is flat. There is no distension.     Palpations: Abdomen is soft.     Tenderness: There is no abdominal tenderness. There is no guarding or rebound.  Musculoskeletal:        General: Normal range of motion.     Cervical back: Neck supple.     Comments: No lower extremity edema. No calf tenderness.   Skin:    General: Skin is warm and dry.  Neurological:     General: No focal deficit present.     Mental Status: She is alert.  Psychiatric:        Mood and Affect: Mood normal.        Behavior: Behavior normal.    ED Results / Procedures / Treatments   Labs (all labs ordered are listed, but only abnormal results are displayed) Labs Reviewed  CBC WITH DIFFERENTIAL/PLATELET - Abnormal; Notable for the following components:      Result Value   WBC 11.7 (*)    Neutro Abs 8.0 (*)    All other components within normal limits  BASIC METABOLIC PANEL - Abnormal; Notable for the following components:   CO2 21 (*)    Glucose, Bld 126 (*)    Creatinine, Ser 1.03 (*)    Calcium 8.7 (*)    All other components within normal limits  HCG, QUANTITATIVE, PREGNANCY - Abnormal; Notable for the following components:   hCG, Beta 03/13/2021 [name] (*)    All other components within normal limits  URINALYSIS, ROUTINE W REFLEX MICROSCOPIC - Abnormal; Notable for the following components:   APPearance CLOUDY (*)    Leukocytes,Ua TRACE (*)    Bacteria, UA RARE (*)    All other components within normal limits  D-DIMER, QUANTITATIVE - Abnormal; Notable for the following components:   D-Dimer, Quant 1.27 (*)    All other components within normal limits   URINE CULTURE  PROTEIN / CREATININE RATIO, URINE  TROPONIN I (HIGH SENSITIVITY)  TROPONIN I (HIGH SENSITIVITY)    EKG EKG Interpretation  Date/Time:  Wednesday May 23 2021 00:56:00 EST Ventricular Rate:  84 PR Interval:  152 QRS  Duration: 80 QT Interval:  376 QTC Calculation: 444 R Axis:   43 Text Interpretation: Normal sinus rhythm with sinus arrhythmia Low voltage QRS Cannot rule out Anterior infarct , age undetermined Abnormal ECG No old tracing to compare Confirmed by Dione Booze ([id number]) on 05/23/2021 1:10:17 AM  Radiology DG Chest 2 View  Result Date: 05/23/2021 CLINICAL DATA:  Chest pain radiating down left arm, [redacted] weeks pregnant EXAM: CHEST - 2 VIEW COMPARISON:  None. FINDINGS: The heart size and mediastinal contours are within normal limits. Both lungs are clear. The visualized skeletal structures are unremarkable. IMPRESSION: No active cardiopulmonary disease. Electronically Signed   By: Sharlet Salina M.D.   On: 05/23/2021 01:34   CT Angio Chest PE W and/or Wo Contrast  Result Date: 05/23/2021 CLINICAL DATA:  Left chest pain radiating into the left shoulder since yesterday. [redacted] weeks pregnant. EXAM: CT ANGIOGRAPHY CHEST WITH CONTRAST TECHNIQUE: Multidetector CT imaging of the chest was performed using the standard protocol during bolus administration of intravenous contrast. Multiplanar CT image reconstructions and MIPs were obtained to evaluate the vascular anatomy. CONTRAST:  OMNIPAQUE IOHEXOL 350 MG/ML SOLN COMPARISON:  Chest x-ray from same day. FINDINGS: Cardiovascular: Satisfactory opacification of the pulmonary arteries to the segmental level. No evidence of pulmonary embolism. Normal heart size. No pericardial effusion. No thoracic aortic aneurysm or dissection. Mediastinum/Nodes: No enlarged mediastinal, hilar, or axillary lymph nodes. Thyroid gland, trachea, and esophagus demonstrate no significant findings. Lungs/Pleura: Lungs are clear. No pleural  effusion or pneumothorax. Upper Abdomen: No acute abnormality. Musculoskeletal: No chest wall abnormality. No acute or significant osseous findings. Review of the MIP images confirms the above findings. IMPRESSION: 1. Normal CTA of the chest. No evidence of pulmonary embolism. Electronically Signed   By: Obie Dredge M.D.   On: 05/23/2021 13:51    Procedures Procedures   Medications Ordered in ED Medications  iohexol (OMNIPAQUE) 350 MG/ML injection 100 mL (100 mLs Intravenous Contrast Given 05/23/21 1338)    ED Course  I have reviewed the triage vital signs and the nursing notes.  Pertinent labs & imaging results that were available during my care of the patient were reviewed by me and considered in my medical decision making (see chart for details).  Clinical Course as of 05/23/21 1415  Wed May 23, 2021  1055 Patient's EKG per my interpretation shows a normal sinus rhythm without evidence of acute ischemic findings, unlikely chronic infarct as noted by machine interpretation. [MT]    Clinical Course User Index [MT] Terald Sleeper, MD   MDM Rules/Calculators/A&P                         30 year old female presents the ED due to left-sided, sharp chest pain that radiates down left arm and to back x2 days.  Chest pain started after lifting her 63-year-old child.  Patient is currently chest pain-free.  No cardiac issues.  No previous blood clots.  Upon arrival, stable vitals.  Patient hypertensive upon arrival at 140/79 however, during my initial evaluation which was roughly 10 hours later BP had normalized to 123/74.  No current headache.  No right upper quadrant pain.  Patient has some tenderness to anterior chest wall however, patient notes this is a different pain than she is feeling.  No lower extremity edema.  Negative Homan sign bilaterally.  Routine labs, troponin, chest x-ray, and EKG ordered at triage.  Added D-dimer to rule out PE given pleuritic nature of pain.  Delta troponin  flat.  EKG demonstrates normal sinus rhythm no signs of acute ischemia.  Low suspicion for ACS.  hCG elevated 117,000.  CBC significant for mild leukocytosis of 11.7.  Normal hemoglobin.  BMP reassuring.  Chest x-ray personally reviewed which is negative for signs of pneumonia, pneumothorax, or widened mediastinum. D-dimer elevated, CTA ordered to rule out PE. Discussed with patient risks of radiation during pregnancy. Patient agreeable to proceed with CTA.  CTA negative. No evidence of PE. No acute abnormalities. Chest pain could be related to MSK etiology. Advised patient to take OTC tylenol as needed for pain. Recommended prenatal. OBGYN referral given to patient at discharge and advised to call to establish care. Strict ED precautions discussed with patient. Patient states understanding and agrees to plan. Patient discharged home in no acute distress and stable vitals    Final Clinical Impression(s) / ED Diagnoses Final diagnoses:  Atypical chest pain    Rx / DC Orders ED Discharge Orders     None        Jesusita Oka 05/23/21 1417    Terald Sleeper, MD 05/23/21 1520

## 2021-05-23 NOTE — ED Provider Notes (Signed)
Emergency Medicine Provider Triage Evaluation Note  Sarah Whitney , a 30 y.o. female  was evaluated in triage.  Pt complains of chest pain.  Started yesterday after lifting toddler, got better but returned today while cleaning.  Pain left chest with radiation into left shoulder.  Currently [redacted]wks pregnant, no prenatal care yet.  Hx of HTN during pregnancy (not diagnosed pre-eclampsia)-- BP at home was 200/100 but EMS repeat was 140/78.  No cardiac history.  No hx of DVT or PE.    Review of Systems  Positive: Chest pain Negative: Cough, fever  Physical Exam  BP (!) 144/79    Pulse 75    Temp 98.3 F (36.8 C) (Oral)    Resp 18    Ht 5\' 4"  (1.626 m)    Wt 110 kg    SpO2 97%    BMI 41.63 kg/m   Gen:   Awake, no distress   Resp:  Normal effort  MSK:   Moves extremities without difficulty  Other:    Medical Decision Making  Medically screening exam initiated at 12:45 AM.  Appropriate orders placed.  Sarah Whitney was informed that the remainder of the evaluation will be completed by another provider, this initial triage assessment does not replace that evaluation, and the importance of remaining in the ED until their evaluation is complete.  Chest pain.  Currently [redacted] wks pregnant, no prenatal care yet.  Hypertensive with EMS, hx of HTN issues during pregnancy but no diagnosis of pre-eclampsia.  EKG, labs, CXR.     Acie Fredrickson, PA-C 05/23/21 05/25/21    [id number], MD 05/23/21 [phone]

## 2021-05-23 NOTE — Discharge Instructions (Signed)
It was a pleasure taking care of you today. As discussed, all of your labs were reassuring. Your CT scan did not show a blood clot. You pain could be related to muscular pain. Take over the counter tylenol as needed for pain. Continue to take a prenatal. I have included the number of the OBGYN call to schedule an appointment to establish care. Return to the ER for new or worsening symptoms.

## 2021-05-25 LAB — URINE CULTURE: Culture: 80000 — AB

## 2021-05-26 NOTE — Progress Notes (Signed)
ED Antimicrobial Stewardship Positive Culture Follow Up   Sarah Whitney is an 30 y.o. female who presented to [facility] on 05/23/2021 with a chief complaint of  Chief Complaint  Patient presents with   Chest Pain    [redacted] Weeks Pregnant   Hypertension    Recent Results (from the past 720 hour(s))  Urine Culture     Status: Abnormal   Collection Time: 05/23/21 12:54 AM   Specimen: Urine, Clean Catch  Result Value Ref Range Status   Specimen Description URINE, CLEAN CATCH  Final   Special Requests   Final    NONE Performed at [facility], [address] [place], [place] [address]    Culture 80,000 COLONIES/mL ESCHERICHIA COLI (A)  Final   Report Status 05/25/2021 FINAL  Final   Organism ID, Bacteria ESCHERICHIA COLI (A)  Final      Susceptibility   Escherichia coli - MIC*    AMPICILLIN 4 SENSITIVE Sensitive     CEFAZOLIN <=4 SENSITIVE Sensitive     CEFEPIME <=0.12 SENSITIVE Sensitive     CEFTRIAXONE <=0.25 SENSITIVE Sensitive     CIPROFLOXACIN <=0.25 SENSITIVE Sensitive     GENTAMICIN <=1 SENSITIVE Sensitive     IMIPENEM <=0.25 SENSITIVE Sensitive     NITROFURANTOIN <=16 SENSITIVE Sensitive     TRIMETH/SULFA <=20 SENSITIVE Sensitive     AMPICILLIN/SULBACTAM <=2 SENSITIVE Sensitive     PIP/TAZO <=4 SENSITIVE Sensitive     * 80,000 COLONIES/mL ESCHERICHIA COLI    [x]  Patient discharged originally without antimicrobial agent and treatment is now indicated  New antibiotic prescription: Cefpodoxime 200 mg twice daily for 10 days (Qty 20; Refills 0)  ED Provider: , PharmD, BCPS 05/26/2021 10:08 AM ED Clinical Pharmacist -  [phone]

## 2021-05-27 ENCOUNTER — Telehealth: Payer: Self-pay | Admitting: Emergency Medicine

## 2021-05-27 NOTE — Telephone Encounter (Signed)
Post ED Visit - Positive Culture Follow-up: Successful Patient Follow-Up  Culture assessed and recommendations reviewed by:  []  , Pharm.D. []  Enzo Bi, Pharm.D., BCPS AQ-ID []  , Pharm.D., BCPS []  Celedonio Miyamoto, Pharm.D., BCPS []  [place], Garvin Fila.D., BCPS, AAHIVP []  , Pharm.D., BCPS, AAHIVP []  Georgina Pillion, PharmD, BCPS []  , PharmD, BCPS []  [place], PharmD, BCPS [x]  [address], PharmD  Positive urine culture  [x]  Patient discharged without antimicrobial prescription and treatment is now indicated []  Organism is resistant to prescribed ED discharge antimicrobial []  Patient with positive blood cultures  Changes discussed with ED provider: PA New antibiotic prescription Cefpodoxime 200 mg BID for ten days Called to [facility] ([address], [place], [place])  Contacted patient, date 05/26/2021, time 1600   Madgeline Rayo C [place] Code 05/27/2021, 11:05 AM

## 2021-06-07 DIAGNOSIS — Z3480 Encounter for supervision of other normal pregnancy, unspecified trimester: Secondary | ICD-10-CM | POA: Diagnosis not present

## 2021-06-07 DIAGNOSIS — Z113 Encounter for screening for infections with a predominantly sexual mode of transmission: Secondary | ICD-10-CM | POA: Diagnosis not present

## 2021-06-07 DIAGNOSIS — Z3481 Encounter for supervision of other normal pregnancy, first trimester: Secondary | ICD-10-CM | POA: Diagnosis not present

## 2021-06-07 DIAGNOSIS — Z3201 Encounter for pregnancy test, result positive: Secondary | ICD-10-CM | POA: Diagnosis not present

## 2021-06-07 DIAGNOSIS — N925 Other specified irregular menstruation: Secondary | ICD-10-CM | POA: Diagnosis not present

## 2021-06-07 DIAGNOSIS — Z124 Encounter for screening for malignant neoplasm of cervix: Secondary | ICD-10-CM | POA: Diagnosis not present

## 2021-06-07 DIAGNOSIS — O26841 Uterine size-date discrepancy, first trimester: Secondary | ICD-10-CM | POA: Diagnosis not present

## 2021-06-07 LAB — OB RESULTS CONSOLE ABO/RH

## 2021-06-07 LAB — OB RESULTS CONSOLE RPR: RPR: NONREACTIVE

## 2021-06-07 LAB — OB RESULTS CONSOLE HIV ANTIBODY (ROUTINE TESTING): HIV: NONREACTIVE

## 2021-06-07 LAB — OB RESULTS CONSOLE GC/CHLAMYDIA
Chlamydia: NEGATIVE
Neisseria Gonorrhea: NEGATIVE

## 2021-06-07 LAB — OB RESULTS CONSOLE HEPATITIS B SURFACE ANTIGEN: Hepatitis B Surface Ag: NEGATIVE

## 2021-06-07 LAB — OB RESULTS CONSOLE RUBELLA ANTIBODY, IGM: Rubella: IMMUNE

## 2021-06-07 LAB — HEPATITIS C ANTIBODY: HCV Ab: NEGATIVE

## 2021-06-07 LAB — OB RESULTS CONSOLE ANTIBODY SCREEN: Antibody Screen: NEGATIVE

## 2021-06-07 NOTE — Progress Notes (Deleted)
Sarah Whitney is a 31 y.o. female here today to follow on ADD/ADHD.  Last f/u visit: ***  Currently *** is on *** *** is tolerating medication well, no side effects reported. *** takes medication from Monday through Friday/daily. Denies depression or anxiety symptoms. No difficulty sleeping.  *** denies any illicit drug use and keeps medication in a safe place.  Review of Systems  Constitutional: Negative for activity change, appetite change, fatigue and unexpected weight change.  Respiratory: Negative for shortness of breath and wheezing.   Cardiovascular: Negative for chest pain, palpitations and leg swelling.  Gastrointestinal: Negative for abdominal pain, nausea and vomiting.       No changes in bowel habits.  Neurological: Negative for tremors and headaches.  Psychiatric/Behavioral: Negative for behavioral problems and sleep disturbance. The patient is not nervous/anxious.    Active Ambulatory Problems    Diagnosis Date Noted   Insomnia disorder 12/10/2019   Moderate intermittent asthma 12/10/2019   Morbid obesity (HCC) 12/10/2019   Hypothyroidism 12/31/2019   Gastroesophageal reflux disease without esophagitis 05/12/2020   Depression, major, recurrent, mild (HCC) 05/12/2020   Attention deficit hyperactivity disorder (ADHD), combined type 05/12/2020   GAD (generalized anxiety disorder) 02/06/2021   Resolved Ambulatory Problems    Diagnosis Date Noted   No Resolved Ambulatory Problems   Past Medical History:  Diagnosis Date   Allergy    Anxiety    Depression    GERD (gastroesophageal reflux disease)     Current Outpatient Medications on File Prior to Visit  Medication Sig Dispense Refill   albuterol (VENTOLIN HFA) 108 (90 Base) MCG/ACT inhaler TAKE 2 PUFFS BY MOUTH EVERY 6 HOURS AS NEEDED FOR WHEEZE OR SHORTNESS OF BREATH 8.5 each 1   amphetamine-dextroamphetamine (ADDERALL XR) 20 MG 24 hr capsule Take 1 capsule (20 mg total) by mouth every morning. 30 capsule 0    amphetamine-dextroamphetamine (ADDERALL XR) 20 MG 24 hr capsule Take 1 capsule (20 mg total) by mouth every morning. 30 capsule 0   levothyroxine (SYNTHROID) 50 MCG tablet TAKE 1 TABLET BY MOUTH EVERY DAY BEFORE BREAKFAST 90 tablet 1   No current facility-administered medications on file prior to visit.    No Known Allergies  Social History   Socioeconomic History   Marital status: Married    Spouse name: Not on file   Number of children: Not on file   Years of education: Not on file   Highest education level: Not on file  Occupational History   Not on file  Tobacco Use   Smoking status: Never   Smokeless tobacco: Never  Substance and Sexual Activity   Alcohol use: Not on file   Drug use: Not on file   Sexual activity: Not on file  Other Topics Concern   Not on file  Social History Narrative   Not on file   Social Determinants of Health   Financial Resource Strain: Not on file  Food Insecurity: Not on file  Transportation Needs: Not on file  Physical Activity: Not on file  Stress: Not on file  Social Connections: Not on file  Intimate Partner Violence: Not on file    LMP 03/11/2021   Physical Exam  Nursing note and vitals reviewed. Constitutional: She is oriented to person, place, and time. She appears well-developed. No distress.  HENT:  Head: Normocephalic and atraumatic.  Mouth/Throat: Oropharynx is clear and moist and mucous membranes are normal.  Eyes: Pupils are equal, round, and reactive to light. Conjunctivae are normal.  Cardiovascular: Normal rate and regular rhythm.  No murmur heard. Respiratory: Effort normal and breath sounds normal. No respiratory distress.  Musculoskeletal:        General: No edema.  Neurological: She is alert and oriented to person, place, and time. She has normal strength. Gait normal.  Skin: Skin is warm. No erythema.  Psychiatric: She has a normal mood and affect. She expresses no suicidal ideation. Well groomed,good eye  contact.   There are no diagnoses linked to this encounter.  No orders of the defined types were placed in this encounter.   No problem-specific Assessment & Plan notes found for this encounter.   No follow-ups on file.  Betty G. Martinique, MD  [facility]. [place] office.

## 2021-06-08 ENCOUNTER — Ambulatory Visit: Payer: BC Managed Care – PPO | Admitting: Family Medicine

## 2021-06-26 ENCOUNTER — Other Ambulatory Visit: Payer: Self-pay | Admitting: Family Medicine

## 2021-07-05 DIAGNOSIS — E039 Hypothyroidism, unspecified: Secondary | ICD-10-CM | POA: Diagnosis not present

## 2021-07-05 DIAGNOSIS — Z369 Encounter for antenatal screening, unspecified: Secondary | ICD-10-CM | POA: Diagnosis not present

## 2021-07-06 ENCOUNTER — Other Ambulatory Visit: Payer: Self-pay | Admitting: Obstetrics and Gynecology

## 2021-07-06 DIAGNOSIS — Z3A19 19 weeks gestation of pregnancy: Secondary | ICD-10-CM

## 2021-07-06 DIAGNOSIS — Z363 Encounter for antenatal screening for malformations: Secondary | ICD-10-CM

## 2021-07-06 DIAGNOSIS — O99212 Obesity complicating pregnancy, second trimester: Secondary | ICD-10-CM

## 2021-07-09 ENCOUNTER — Other Ambulatory Visit: Payer: Self-pay

## 2021-08-01 ENCOUNTER — Encounter: Payer: Self-pay | Admitting: *Deleted

## 2021-08-03 DIAGNOSIS — E039 Hypothyroidism, unspecified: Secondary | ICD-10-CM | POA: Diagnosis not present

## 2021-08-03 DIAGNOSIS — Z369 Encounter for antenatal screening, unspecified: Secondary | ICD-10-CM | POA: Diagnosis not present

## 2021-08-06 ENCOUNTER — Ambulatory Visit (HOSPITAL_BASED_OUTPATIENT_CLINIC_OR_DEPARTMENT_OTHER): Payer: BC Managed Care – PPO | Admitting: Obstetrics and Gynecology

## 2021-08-06 ENCOUNTER — Ambulatory Visit: Payer: 59 | Admitting: *Deleted

## 2021-08-06 ENCOUNTER — Ambulatory Visit: Payer: BC Managed Care – PPO | Attending: Obstetrics and Gynecology

## 2021-08-06 ENCOUNTER — Encounter: Payer: Self-pay | Admitting: *Deleted

## 2021-08-06 ENCOUNTER — Ambulatory Visit: Payer: 59 | Attending: Obstetrics and Gynecology

## 2021-08-06 ENCOUNTER — Other Ambulatory Visit: Payer: Self-pay

## 2021-08-06 VITALS — BP 137/77 | HR 92

## 2021-08-06 DIAGNOSIS — Z6841 Body Mass Index (BMI) 40.0 and over, adult: Secondary | ICD-10-CM

## 2021-08-06 DIAGNOSIS — O99212 Obesity complicating pregnancy, second trimester: Secondary | ICD-10-CM

## 2021-08-06 DIAGNOSIS — O99282 Endocrine, nutritional and metabolic diseases complicating pregnancy, second trimester: Secondary | ICD-10-CM | POA: Diagnosis not present

## 2021-08-06 DIAGNOSIS — O283 Abnormal ultrasonic finding on antenatal screening of mother: Secondary | ICD-10-CM

## 2021-08-06 DIAGNOSIS — O358XX0 Maternal care for other (suspected) fetal abnormality and damage, not applicable or unspecified: Secondary | ICD-10-CM | POA: Diagnosis not present

## 2021-08-06 DIAGNOSIS — E669 Obesity, unspecified: Secondary | ICD-10-CM

## 2021-08-06 DIAGNOSIS — O35DXX0 Maternal care for other (suspected) fetal abnormality and damage, fetal gastrointestinal anomalies, not applicable or unspecified: Secondary | ICD-10-CM

## 2021-08-06 DIAGNOSIS — Z3A19 19 weeks gestation of pregnancy: Secondary | ICD-10-CM | POA: Diagnosis not present

## 2021-08-06 DIAGNOSIS — E039 Hypothyroidism, unspecified: Secondary | ICD-10-CM

## 2021-08-06 DIAGNOSIS — O99280 Endocrine, nutritional and metabolic diseases complicating pregnancy, unspecified trimester: Secondary | ICD-10-CM | POA: Diagnosis not present

## 2021-08-06 DIAGNOSIS — J45909 Unspecified asthma, uncomplicated: Secondary | ICD-10-CM

## 2021-08-06 DIAGNOSIS — O99512 Diseases of the respiratory system complicating pregnancy, second trimester: Secondary | ICD-10-CM

## 2021-08-06 DIAGNOSIS — Z363 Encounter for antenatal screening for malformations: Secondary | ICD-10-CM | POA: Diagnosis not present

## 2021-08-06 DIAGNOSIS — O09299 Supervision of pregnancy with other poor reproductive or obstetric history, unspecified trimester: Secondary | ICD-10-CM | POA: Diagnosis not present

## 2021-08-06 NOTE — Progress Notes (Signed)
Maternal-Fetal Medicine  ? ?Name: Sarah Whitney ?DOB: Sep 05, 1990 ?MRN: [id number] ?Referring Provider: Waynard Reeds, MD ? ?I had the pleasure of seeing Ms. Heaton today at the [facility]. She is G3 P1011 at 19w 1d gestation and is here for fetal anatomy scan. ? ?On cell-free fetal DNA screening, the risks of fetal aneuploidies are not increased.  Patient does not give history of vaginal bleeding in this pregnancy.  She did not have fever or rashes in this pregnancy. ? ?Obstetrical history is significant for a term vaginal delivery.  Her delivery was complicated by shoulder dystocia with no neurological injuries.  Patient reports son has autism spectrum disorder. ? ?Past medical history: Hypothyroidism and the patient is taking levothyroxine supplements.  She has mild intermittent asthma and takes albuterol.  She is class III obesity (prepregnancy BMI 40.7). ? ?Ultrasound ?We performed a fetal anatomical survey.  Amniotic fluid is normal and good fetal activity seen.  Fetal biometry is consistent with the previously established dates.  Echogenic bowel is seen.  No other markers of aneuploidies or fetal structural defects are seen. ? ?Echogenic bowel  ?-I discussed the finding of echogenic bowel seen on today's ultrasound. Echogenic bowel can be associated with ?  ?a) Fetal aneuploidies (most commonly, Down syndrome) are seen in 3% to 5% of fetuses with echogenic bowel.  Given that she had low risk for fetal Down syndrome on cell free fetal DNA screening, this is not considered a marker for Down syndrome. ?B) Fetal growth restriction.  I reassured couple of normal fetal growth assessment.  Patient should have serial fetal growth assessments because of Class 3 obesity. ?C) Fetal infection is only rarely associated with this finding. I counseled her on screening for CMV and toxoplasmosis. Patient does not have a history of fever or rashes. CMV infection is unlikely.  ?d) Cystic fibrosis (CF).  We do not  have the results of cystic fibrosis carrier screening. ?e) Normal fetus. ? ?I recommended screening for toxoplasmosis, CMV infections and cystic fibrosis carrier screening.  Patient opted to have blood drawn today.  We sent her to [facility] to have her blood drawn.  We will communicate the results to her. ?I reassured the patient that in the absence of above-mentioned causes, we should expect good pregnancy outcomes.  ? ?History of shoulder dystocia ?The recurrence rate of shoulder just RCIS 10% to 15% in the absence of gestational diabetes.  Patient is very keen to have elective cesarean delivery. It is reasonable to perform cesarean delivery at [redacted] weeks gestation. ? ?Recommendations ?-An appointment was made for her to return in 4 weeks for completion of fetal anatomy. ?-Fetal growth assessments every 4 weeks. ?-Weekly BPP from [redacted] weeks gestation till delivery. ?-CMV, toxoplasmosis screening. ?-Cystic fibrosis (CF) carrier screening. ?-Blood was drawn today at [facility] for infectious work-up and CF screening. We will communicate the results to the patient. ?-TSH estimation to adjust levothyroxine. ? ?Thank you for consultation.  If you have any questions or concerns, please contact me the [facility].  Consultation including face-to-face (more than 50%) counseling 30 minutes. ? ? ? ? ? ?

## 2021-08-07 ENCOUNTER — Other Ambulatory Visit: Payer: Self-pay | Admitting: *Deleted

## 2021-08-07 ENCOUNTER — Telehealth: Payer: Self-pay | Admitting: Obstetrics and Gynecology

## 2021-08-07 DIAGNOSIS — Z6841 Body Mass Index (BMI) 40.0 and over, adult: Secondary | ICD-10-CM

## 2021-08-07 DIAGNOSIS — E039 Hypothyroidism, unspecified: Secondary | ICD-10-CM

## 2021-08-07 LAB — CMV IGM: CMV IgM Ser EIA-aCnc: <30 AU/mL (ref 0.0–29.9)

## 2021-08-07 LAB — INFECT DISEASE AB IGM REFLEX 1

## 2021-08-07 LAB — TOXOPLASMA GONDII ANTIBODY, IGM: Toxoplasma Antibody- IgM: <3 AU/mL (ref 0.0–7.9)

## 2021-08-07 LAB — CMV ANTIBODY, IGG (EIA): CMV Ab - IgG: <0.6 U/mL (ref 0.00–0.59)

## 2021-08-07 LAB — TOXOPLASMA GONDII ANTIBODY, IGG: Toxoplasma IgG Ratio: <3 IU/mL (ref 0.0–7.1)

## 2021-08-07 NOTE — Telephone Encounter (Signed)
I spoke with Ms. Province to let her know the results of the CMV and Toxoplasmosis testing, which were negative (both IgG and IgM). Therefore, we do not expect these infections to be the cause for the echogenic bowel in her current pregnancy.   ? ?[employer] carrier screening is pending and should be available in approximately 2 weeks. She was informed that we will contact her when those results become available. ? ?We may be reached at [phone]. ? ?Cherly Anderson, MS, CGC ? ? ? ?

## 2021-08-08 ENCOUNTER — Encounter: Payer: Self-pay | Admitting: Obstetrics and Gynecology

## 2021-08-08 DIAGNOSIS — O35DXX0 Maternal care for other (suspected) fetal abnormality and damage, fetal gastrointestinal anomalies, not applicable or unspecified: Secondary | ICD-10-CM | POA: Insufficient documentation

## 2021-08-17 ENCOUNTER — Other Ambulatory Visit: Payer: Self-pay

## 2021-08-21 ENCOUNTER — Telehealth: Payer: Self-pay | Admitting: Genetics

## 2021-08-21 NOTE — Telephone Encounter (Signed)
Called [facility] to return negative carrier screening results. Left voicemail with [facility] call back number. ?

## 2021-09-04 ENCOUNTER — Ambulatory Visit: Payer: 59 | Attending: Obstetrics

## 2021-09-04 ENCOUNTER — Ambulatory Visit: Payer: 59 | Admitting: *Deleted

## 2021-09-04 VITALS — BP 129/76 | HR 93

## 2021-09-04 DIAGNOSIS — O09292 Supervision of pregnancy with other poor reproductive or obstetric history, second trimester: Secondary | ICD-10-CM

## 2021-09-04 DIAGNOSIS — Z6841 Body Mass Index (BMI) 40.0 and over, adult: Secondary | ICD-10-CM | POA: Diagnosis not present

## 2021-09-04 DIAGNOSIS — O99212 Obesity complicating pregnancy, second trimester: Secondary | ICD-10-CM

## 2021-09-04 DIAGNOSIS — O99512 Diseases of the respiratory system complicating pregnancy, second trimester: Secondary | ICD-10-CM

## 2021-09-04 DIAGNOSIS — J45909 Unspecified asthma, uncomplicated: Secondary | ICD-10-CM

## 2021-09-04 DIAGNOSIS — E039 Hypothyroidism, unspecified: Secondary | ICD-10-CM | POA: Insufficient documentation

## 2021-09-04 DIAGNOSIS — O358XX0 Maternal care for other (suspected) fetal abnormality and damage, not applicable or unspecified: Secondary | ICD-10-CM

## 2021-09-04 DIAGNOSIS — O99282 Endocrine, nutritional and metabolic diseases complicating pregnancy, second trimester: Secondary | ICD-10-CM | POA: Diagnosis not present

## 2021-09-04 DIAGNOSIS — Z3A23 23 weeks gestation of pregnancy: Secondary | ICD-10-CM

## 2021-09-04 DIAGNOSIS — Z362 Encounter for other antenatal screening follow-up: Secondary | ICD-10-CM

## 2021-09-04 DIAGNOSIS — E669 Obesity, unspecified: Secondary | ICD-10-CM

## 2021-09-05 ENCOUNTER — Other Ambulatory Visit: Payer: Self-pay | Admitting: *Deleted

## 2021-09-05 DIAGNOSIS — E039 Hypothyroidism, unspecified: Secondary | ICD-10-CM

## 2021-09-05 DIAGNOSIS — O09299 Supervision of pregnancy with other poor reproductive or obstetric history, unspecified trimester: Secondary | ICD-10-CM

## 2021-10-02 ENCOUNTER — Ambulatory Visit: Payer: 59 | Attending: Maternal & Fetal Medicine

## 2021-10-02 ENCOUNTER — Ambulatory Visit: Payer: 59 | Admitting: *Deleted

## 2021-10-02 VITALS — BP 133/77 | HR 91

## 2021-10-02 DIAGNOSIS — O99282 Endocrine, nutritional and metabolic diseases complicating pregnancy, second trimester: Secondary | ICD-10-CM | POA: Diagnosis not present

## 2021-10-02 DIAGNOSIS — E039 Hypothyroidism, unspecified: Secondary | ICD-10-CM

## 2021-10-02 DIAGNOSIS — Z3A27 27 weeks gestation of pregnancy: Secondary | ICD-10-CM

## 2021-10-02 DIAGNOSIS — O09299 Supervision of pregnancy with other poor reproductive or obstetric history, unspecified trimester: Secondary | ICD-10-CM | POA: Diagnosis not present

## 2021-10-02 DIAGNOSIS — O99212 Obesity complicating pregnancy, second trimester: Secondary | ICD-10-CM

## 2021-10-02 DIAGNOSIS — O283 Abnormal ultrasonic finding on antenatal screening of mother: Secondary | ICD-10-CM | POA: Diagnosis present

## 2021-10-02 DIAGNOSIS — O99512 Diseases of the respiratory system complicating pregnancy, second trimester: Secondary | ICD-10-CM

## 2021-10-02 DIAGNOSIS — O358XX0 Maternal care for other (suspected) fetal abnormality and damage, not applicable or unspecified: Secondary | ICD-10-CM

## 2021-10-02 DIAGNOSIS — J45909 Unspecified asthma, uncomplicated: Secondary | ICD-10-CM

## 2021-10-03 ENCOUNTER — Other Ambulatory Visit: Payer: Self-pay | Admitting: *Deleted

## 2021-10-03 DIAGNOSIS — Z6841 Body Mass Index (BMI) 40.0 and over, adult: Secondary | ICD-10-CM

## 2021-10-03 DIAGNOSIS — E039 Hypothyroidism, unspecified: Secondary | ICD-10-CM

## 2021-10-03 DIAGNOSIS — O283 Abnormal ultrasonic finding on antenatal screening of mother: Secondary | ICD-10-CM

## 2021-11-01 ENCOUNTER — Ambulatory Visit: Payer: 59 | Attending: Maternal & Fetal Medicine

## 2021-11-01 ENCOUNTER — Ambulatory Visit: Payer: 59 | Admitting: *Deleted

## 2021-11-01 ENCOUNTER — Other Ambulatory Visit: Payer: Self-pay | Admitting: *Deleted

## 2021-11-01 VITALS — BP 124/71 | HR 104

## 2021-11-01 DIAGNOSIS — Z3689 Encounter for other specified antenatal screening: Secondary | ICD-10-CM

## 2021-11-01 DIAGNOSIS — Z362 Encounter for other antenatal screening follow-up: Secondary | ICD-10-CM | POA: Diagnosis not present

## 2021-11-01 DIAGNOSIS — O133 Gestational [pregnancy-induced] hypertension without significant proteinuria, third trimester: Secondary | ICD-10-CM

## 2021-11-01 DIAGNOSIS — O09293 Supervision of pregnancy with other poor reproductive or obstetric history, third trimester: Secondary | ICD-10-CM

## 2021-11-01 DIAGNOSIS — O358XX0 Maternal care for other (suspected) fetal abnormality and damage, not applicable or unspecified: Secondary | ICD-10-CM | POA: Insufficient documentation

## 2021-11-01 DIAGNOSIS — O99891 Other specified diseases and conditions complicating pregnancy: Secondary | ICD-10-CM | POA: Insufficient documentation

## 2021-11-01 DIAGNOSIS — Z3A31 31 weeks gestation of pregnancy: Secondary | ICD-10-CM | POA: Insufficient documentation

## 2021-11-01 DIAGNOSIS — J45909 Unspecified asthma, uncomplicated: Secondary | ICD-10-CM | POA: Diagnosis not present

## 2021-11-01 DIAGNOSIS — Z6841 Body Mass Index (BMI) 40.0 and over, adult: Secondary | ICD-10-CM | POA: Diagnosis not present

## 2021-11-01 DIAGNOSIS — E039 Hypothyroidism, unspecified: Secondary | ICD-10-CM

## 2021-11-01 DIAGNOSIS — O99213 Obesity complicating pregnancy, third trimester: Secondary | ICD-10-CM

## 2021-11-01 DIAGNOSIS — E669 Obesity, unspecified: Secondary | ICD-10-CM

## 2021-11-01 DIAGNOSIS — O35DXX1 Maternal care for other (suspected) fetal abnormality and damage, fetal gastrointestinal anomalies, fetus 1: Secondary | ICD-10-CM | POA: Insufficient documentation

## 2021-11-01 DIAGNOSIS — O99513 Diseases of the respiratory system complicating pregnancy, third trimester: Secondary | ICD-10-CM

## 2021-11-01 DIAGNOSIS — O283 Abnormal ultrasonic finding on antenatal screening of mother: Secondary | ICD-10-CM | POA: Diagnosis present

## 2021-11-01 DIAGNOSIS — O99280 Endocrine, nutritional and metabolic diseases complicating pregnancy, unspecified trimester: Secondary | ICD-10-CM

## 2021-11-21 ENCOUNTER — Encounter (HOSPITAL_COMMUNITY): Payer: Self-pay

## 2021-11-21 ENCOUNTER — Telehealth (HOSPITAL_COMMUNITY): Payer: Self-pay | Admitting: *Deleted

## 2021-11-21 NOTE — Telephone Encounter (Signed)
Preadmission screen  

## 2021-11-21 NOTE — Patient Instructions (Signed)
Sarah Whitney  11/21/2021   Your procedure is scheduled on:  12/13/2021  Arrive at 1030 at Entrance C on [address] at [facility]  and [employer]. You are invited to use the FREE valet parking or use the Visitor's parking deck.  Pick up the phone at the desk and dial [phone].  Call this number if you have problems the morning of surgery: [phone]  Remember:   Do not eat food:(After Midnight) Desps de medianoche.  Do not drink clear liquids: (After Midnight) Desps de medianoche.  Take these medicines the morning of surgery with A SIP OF WATER:  Take levothyroxine as prescribed and bring your inhaler with you   Do not wear jewelry, make-up or nail polish.  Do not wear lotions, powders, or perfumes. Do not wear deodorant.  Do not shave 48 hours prior to surgery.  Do not bring valuables to the hospital.  [facility] is not   responsible for any belongings or valuables brought to the hospital.  Contacts, dentures or bridgework may not be worn into surgery.  Leave suitcase in the car. After surgery it may be brought to your room.  For patients admitted to the hospital, checkout time is 11:00 AM the day of              discharge.      Please read over the following fact sheets that you were given:     Preparing for Surgery

## 2021-11-22 ENCOUNTER — Telehealth (HOSPITAL_COMMUNITY): Payer: Self-pay | Admitting: *Deleted

## 2021-11-22 NOTE — Telephone Encounter (Signed)
Preadmission screen  

## 2021-11-23 ENCOUNTER — Telehealth (HOSPITAL_COMMUNITY): Payer: Self-pay | Admitting: *Deleted

## 2021-11-23 NOTE — Telephone Encounter (Signed)
Preadmission screen  

## 2021-11-26 ENCOUNTER — Other Ambulatory Visit: Payer: Self-pay | Admitting: Obstetrics and Gynecology

## 2021-11-26 DIAGNOSIS — O133 Gestational [pregnancy-induced] hypertension without significant proteinuria, third trimester: Secondary | ICD-10-CM

## 2021-11-29 ENCOUNTER — Ambulatory Visit: Payer: 59 | Admitting: *Deleted

## 2021-11-29 ENCOUNTER — Ambulatory Visit: Payer: 59 | Attending: Obstetrics

## 2021-11-29 VITALS — BP 124/74 | HR 85

## 2021-11-29 DIAGNOSIS — O99283 Endocrine, nutritional and metabolic diseases complicating pregnancy, third trimester: Secondary | ICD-10-CM

## 2021-11-29 DIAGNOSIS — O358XX0 Maternal care for other (suspected) fetal abnormality and damage, not applicable or unspecified: Secondary | ICD-10-CM

## 2021-11-29 DIAGNOSIS — O133 Gestational [pregnancy-induced] hypertension without significant proteinuria, third trimester: Secondary | ICD-10-CM

## 2021-11-29 DIAGNOSIS — Z3A35 35 weeks gestation of pregnancy: Secondary | ICD-10-CM

## 2021-11-29 DIAGNOSIS — E039 Hypothyroidism, unspecified: Secondary | ICD-10-CM

## 2021-11-29 DIAGNOSIS — O99213 Obesity complicating pregnancy, third trimester: Secondary | ICD-10-CM | POA: Diagnosis not present

## 2021-11-29 DIAGNOSIS — E669 Obesity, unspecified: Secondary | ICD-10-CM

## 2021-11-29 DIAGNOSIS — Z3689 Encounter for other specified antenatal screening: Secondary | ICD-10-CM | POA: Diagnosis not present

## 2021-11-29 DIAGNOSIS — J45909 Unspecified asthma, uncomplicated: Secondary | ICD-10-CM

## 2021-11-29 DIAGNOSIS — O09293 Supervision of pregnancy with other poor reproductive or obstetric history, third trimester: Secondary | ICD-10-CM | POA: Diagnosis present

## 2021-11-29 DIAGNOSIS — O99513 Diseases of the respiratory system complicating pregnancy, third trimester: Secondary | ICD-10-CM

## 2021-12-10 ENCOUNTER — Telehealth (HOSPITAL_COMMUNITY): Payer: Self-pay | Admitting: *Deleted

## 2021-12-10 ENCOUNTER — Encounter (HOSPITAL_COMMUNITY): Payer: Self-pay

## 2021-12-10 NOTE — Telephone Encounter (Signed)
Preadmission screen message left on her number and her spouse number.  Verified number with office.  Asked office to call pt and ask her to contact me regarding preop visit.

## 2021-12-11 ENCOUNTER — Encounter (HOSPITAL_COMMUNITY)
Admission: RE | Admit: 2021-12-11 | Discharge: 2021-12-11 | Disposition: A | Discharge status: Home / Self Care | Payer: 59 | Source: Ambulatory Visit | Attending: Obstetrics and Gynecology | Admitting: Obstetrics and Gynecology

## 2021-12-11 HISTORY — DX: Gestational (pregnancy-induced) hypertension without significant proteinuria, unspecified trimester: O13.9

## 2021-12-12 NOTE — Anesthesia Preprocedure Evaluation (Addendum)
Anesthesia Evaluation  Patient identified by MRN, date of birth, ID band Patient awake    Reviewed: Allergy & Precautions, NPO status , Patient's Chart, lab work & pertinent test results  Airway Mallampati: II  TM Distance: >3 FB Neck ROM: Full    Dental no notable dental hx. (+) Teeth Intact, Dental Advisory Given   Pulmonary asthma ,    Pulmonary exam normal breath sounds clear to auscultation       Cardiovascular hypertension, Normal cardiovascular exam Rhythm:Regular Rate:Normal     Neuro/Psych negative neurological ROS     GI/Hepatic Neg liver ROS, GERD  ,  Endo/Other  Hypothyroidism   Renal/GU      Musculoskeletal   Abdominal (+) + obese (BMI 44.6),   Peds  Hematology   Anesthesia Other Findings   Reproductive/Obstetrics (+) Pregnancy                            Anesthesia Physical Anesthesia Plan  ASA: 3  Anesthesia Plan: Spinal   Post-op Pain Management:    Induction:   PONV Risk Score and Plan: 2 and Treatment may vary due to age or medical condition and Ondansetron  Airway Management Planned: Natural Airway and Nasal Cannula  Additional Equipment: None  Intra-op Plan:   Post-operative Plan:   Informed Consent:     Dental advisory given  Plan Discussed with:   Anesthesia Plan Comments:         Anesthesia Quick Evaluation

## 2021-12-13 ENCOUNTER — Encounter (HOSPITAL_COMMUNITY)
Admission: AD | Disposition: A | Discharge status: Home / Self Care | Payer: Self-pay | Source: Home / Self Care | Attending: Obstetrics and Gynecology

## 2021-12-13 ENCOUNTER — Encounter (HOSPITAL_COMMUNITY): Payer: Self-pay | Admitting: Obstetrics and Gynecology

## 2021-12-13 ENCOUNTER — Inpatient Hospital Stay (HOSPITAL_COMMUNITY): Payer: 59 | Admitting: Anesthesiology

## 2021-12-13 ENCOUNTER — Other Ambulatory Visit: Payer: Self-pay

## 2021-12-13 ENCOUNTER — Inpatient Hospital Stay (HOSPITAL_COMMUNITY)
Admission: AD | Admit: 2021-12-13 | Discharge: 2021-12-16 | DRG: 788 | Disposition: A | Discharge status: Home / Self Care | Payer: 59 | Attending: Obstetrics and Gynecology | Admitting: Obstetrics and Gynecology

## 2021-12-13 DIAGNOSIS — O26893 Other specified pregnancy related conditions, third trimester: Secondary | ICD-10-CM | POA: Diagnosis present

## 2021-12-13 DIAGNOSIS — J45909 Unspecified asthma, uncomplicated: Secondary | ICD-10-CM

## 2021-12-13 DIAGNOSIS — E039 Hypothyroidism, unspecified: Secondary | ICD-10-CM | POA: Diagnosis present

## 2021-12-13 DIAGNOSIS — O134 Gestational [pregnancy-induced] hypertension without significant proteinuria, complicating childbirth: Secondary | ICD-10-CM | POA: Diagnosis present

## 2021-12-13 DIAGNOSIS — O133 Gestational [pregnancy-induced] hypertension without significant proteinuria, third trimester: Secondary | ICD-10-CM

## 2021-12-13 DIAGNOSIS — O99824 Streptococcus B carrier state complicating childbirth: Secondary | ICD-10-CM | POA: Diagnosis present

## 2021-12-13 DIAGNOSIS — O9952 Diseases of the respiratory system complicating childbirth: Secondary | ICD-10-CM | POA: Diagnosis not present

## 2021-12-13 DIAGNOSIS — O9081 Anemia of the puerperium: Secondary | ICD-10-CM | POA: Diagnosis not present

## 2021-12-13 DIAGNOSIS — Z3A37 37 weeks gestation of pregnancy: Secondary | ICD-10-CM

## 2021-12-13 DIAGNOSIS — O99284 Endocrine, nutritional and metabolic diseases complicating childbirth: Secondary | ICD-10-CM | POA: Diagnosis present

## 2021-12-13 DIAGNOSIS — O99214 Obesity complicating childbirth: Secondary | ICD-10-CM | POA: Diagnosis present

## 2021-12-13 LAB — CBC
HCT: 31.2 % — ABNORMAL LOW (ref 36.0–46.0)
Hemoglobin: 10.4 g/dL — ABNORMAL LOW (ref 12.0–15.0)
MCH: 27.5 pg (ref 26.0–34.0)
MCHC: 33.3 g/dL (ref 30.0–36.0)
MCV: 82.5 fL (ref 80.0–100.0)
Platelets: 224 10*3/uL (ref 150–400)
RBC: 3.78 MIL/uL — ABNORMAL LOW (ref 3.87–5.11)
RDW: 13.7 % (ref 11.5–15.5)
WBC: 11.4 10*3/uL — ABNORMAL HIGH (ref 4.0–10.5)
nRBC: 0 % (ref 0.0–0.2)

## 2021-12-13 LAB — COMPREHENSIVE METABOLIC PANEL
ALT: 11 U/L (ref 0–44)
AST: 13 U/L — ABNORMAL LOW (ref 15–41)
Albumin: 2.7 g/dL — ABNORMAL LOW (ref 3.5–5.0)
Alkaline Phosphatase: 90 U/L (ref 38–126)
Anion gap: 8 (ref 5–15)
BUN: 10 mg/dL (ref 6–20)
CO2: 19 mmol/L — ABNORMAL LOW (ref 22–32)
Calcium: 9.4 mg/dL (ref 8.9–10.3)
Chloride: 111 mmol/L (ref 98–111)
Creatinine, Ser: 0.99 mg/dL (ref 0.44–1.00)
GFR, Estimated: >60 mL/min (ref >60)
Glucose, Bld: 79 mg/dL (ref 70–99)
Potassium: 3.6 mmol/L (ref 3.5–5.1)
Sodium: 138 mmol/L (ref 135–145)
Total Bilirubin: 0.7 mg/dL (ref 0.3–1.2)
Total Protein: 6.4 g/dL — ABNORMAL LOW (ref 6.5–8.1)

## 2021-12-13 LAB — TYPE AND SCREEN
ABO/RH(D): A POS
Antibody Screen: NEGATIVE

## 2021-12-13 LAB — RPR: RPR Ser Ql: NONREACTIVE

## 2021-12-13 LAB — ABO/RH: ABO/RH(D): A POS

## 2021-12-13 SURGERY — Surgical Case
Anesthesia: Spinal | Site: Abdomen | Wound class: Clean Contaminated

## 2021-12-13 MED ORDER — OXYTOCIN-SODIUM CHLORIDE 30-0.9 UT/500ML-% IV SOLN
2.5000 [IU]/h | INTRAVENOUS | Status: AC
  Administered 2021-12-13: 2.5 [IU]/h via INTRAVENOUS
  Filled 2021-12-13: qty 500

## 2021-12-13 MED ORDER — KETOROLAC TROMETHAMINE 30 MG/ML IJ SOLN: 30.0000 mg | Freq: Once | INTRAMUSCULAR | Status: DC | PRN

## 2021-12-13 MED ORDER — OXYTOCIN-SODIUM CHLORIDE 30-0.9 UT/500ML-% IV SOLN
INTRAVENOUS | Status: AC
  Filled 2021-12-13: qty 500

## 2021-12-13 MED ORDER — CEFAZOLIN IN SODIUM CHLORIDE 3-0.9 GM/100ML-% IV SOLN
3.0000 g | INTRAVENOUS | Status: AC
  Administered 2021-12-13: 3 g via INTRAVENOUS

## 2021-12-13 MED ORDER — DIBUCAINE (PERIANAL) 1 % EX OINT: 1.0000 | TOPICAL_OINTMENT | CUTANEOUS | Status: DC | PRN

## 2021-12-13 MED ORDER — SIMETHICONE 80 MG PO CHEW
80.0000 mg | CHEWABLE_TABLET | Freq: Three times a day (TID) | ORAL | Status: DC
  Administered 2021-12-14 – 2021-12-16 (×6): 80 mg via ORAL
  Filled 2021-12-13 (×6): qty 1

## 2021-12-13 MED ORDER — FENTANYL CITRATE (PF) 100 MCG/2ML IJ SOLN
INTRAMUSCULAR | Status: DC | PRN
Start: 2021-12-13 — End: 2021-12-13
  Administered 2021-12-13: 15 ug via INTRATHECAL

## 2021-12-13 MED ORDER — ACETAMINOPHEN 325 MG PO TABS
650.0000 mg | ORAL_TABLET | ORAL | Status: DC | PRN
  Administered 2021-12-13 – 2021-12-16 (×5): 650 mg via ORAL
  Filled 2021-12-13 (×5): qty 2

## 2021-12-13 MED ORDER — PRENATAL MULTIVITAMIN CH
1.0000 | ORAL_TABLET | Freq: Every day | ORAL | Status: DC
Start: 2021-12-14 — End: 2021-12-16
  Administered 2021-12-14 – 2021-12-16 (×3): 1 via ORAL
  Filled 2021-12-13 (×3): qty 1

## 2021-12-13 MED ORDER — ONDANSETRON HCL 4 MG/2ML IJ SOLN
INTRAMUSCULAR | Status: DC | PRN
  Administered 2021-12-13: 4 mg via INTRAVENOUS

## 2021-12-13 MED ORDER — SCOPOLAMINE 1 MG/3DAYS TD PT72: 1.0000 | MEDICATED_PATCH | Freq: Once | TRANSDERMAL | Status: DC

## 2021-12-13 MED ORDER — DIPHENHYDRAMINE HCL 25 MG PO CAPS: 25.0000 mg | ORAL_CAPSULE | Freq: Four times a day (QID) | ORAL | Status: DC | PRN

## 2021-12-13 MED ORDER — ALBUTEROL SULFATE HFA 108 (90 BASE) MCG/ACT IN AERS: 1.0000 | INHALATION_SPRAY | RESPIRATORY_TRACT | Status: DC | PRN

## 2021-12-13 MED ORDER — OXYCODONE HCL 5 MG PO TABS
5.0000 mg | ORAL_TABLET | ORAL | Status: DC | PRN
  Administered 2021-12-14 – 2021-12-15 (×2): 5 mg via ORAL
  Administered 2021-12-15 – 2021-12-16 (×2): 10 mg via ORAL
  Filled 2021-12-13 (×3): qty 1
  Filled 2021-12-13: qty 2

## 2021-12-13 MED ORDER — PHENYLEPHRINE HCL-NACL 20-0.9 MG/250ML-% IV SOLN
INTRAVENOUS | Status: AC
  Filled 2021-12-13: qty 250

## 2021-12-13 MED ORDER — MENTHOL 3 MG MT LOZG: 1.0000 | LOZENGE | OROMUCOSAL | Status: DC | PRN

## 2021-12-13 MED ORDER — COCONUT OIL OIL: 1.0000 | TOPICAL_OIL | Status: DC | PRN

## 2021-12-13 MED ORDER — DIPHENHYDRAMINE HCL 25 MG PO CAPS: 25.0000 mg | ORAL_CAPSULE | ORAL | Status: DC | PRN

## 2021-12-13 MED ORDER — TETANUS-DIPHTH-ACELL PERTUSSIS 5-2.5-18.5 LF-MCG/0.5 IM SUSY: 0.5000 mL | PREFILLED_SYRINGE | Freq: Once | INTRAMUSCULAR | Status: DC

## 2021-12-13 MED ORDER — PHENYLEPHRINE 80 MCG/ML (10ML) SYRINGE FOR IV PUSH (FOR BLOOD PRESSURE SUPPORT)
PREFILLED_SYRINGE | INTRAVENOUS | Status: AC
  Filled 2021-12-13: qty 10

## 2021-12-13 MED ORDER — ENOXAPARIN SODIUM 60 MG/0.6ML IJ SOSY
60.0000 mg | PREFILLED_SYRINGE | INTRAMUSCULAR | Status: DC
  Administered 2021-12-14 – 2021-12-16 (×3): 60 mg via SUBCUTANEOUS
  Filled 2021-12-13 (×3): qty 0.6

## 2021-12-13 MED ORDER — BUPIVACAINE IN DEXTROSE 0.75-8.25 % IT SOLN
INTRATHECAL | Status: DC | PRN
  Administered 2021-12-13: 12 mg via INTRATHECAL

## 2021-12-13 MED ORDER — NALOXONE HCL 0.4 MG/ML IJ SOLN: 0.4000 mg | INTRAMUSCULAR | Status: DC | PRN

## 2021-12-13 MED ORDER — MORPHINE SULFATE (PF) 0.5 MG/ML IJ SOLN
INTRAMUSCULAR | Status: DC | PRN
  Administered 2021-12-13: 150 ug via INTRATHECAL

## 2021-12-13 MED ORDER — SIMETHICONE 80 MG PO CHEW: 80.0000 mg | CHEWABLE_TABLET | ORAL | Status: DC | PRN

## 2021-12-13 MED ORDER — HYDROMORPHONE HCL 1 MG/ML IJ SOLN: 0.2500 mg | INTRAMUSCULAR | Status: DC | PRN

## 2021-12-13 MED ORDER — MEPERIDINE HCL 25 MG/ML IJ SOLN: 6.2500 mg | INTRAMUSCULAR | Status: DC | PRN

## 2021-12-13 MED ORDER — ONDANSETRON HCL 4 MG/2ML IJ SOLN: 4.0000 mg | Freq: Once | INTRAMUSCULAR | Status: DC | PRN

## 2021-12-13 MED ORDER — SODIUM CHLORIDE 0.9 % IR SOLN
Status: DC | PRN
  Administered 2021-12-13: 1000 mL

## 2021-12-13 MED ORDER — MORPHINE SULFATE (PF) 0.5 MG/ML IJ SOLN
INTRAMUSCULAR | Status: AC
  Filled 2021-12-13: qty 10

## 2021-12-13 MED ORDER — ACETAMINOPHEN 10 MG/ML IV SOLN
INTRAVENOUS | Status: DC | PRN
  Administered 2021-12-13: 1000 mg via INTRAVENOUS

## 2021-12-13 MED ORDER — OXYTOCIN-SODIUM CHLORIDE 30-0.9 UT/500ML-% IV SOLN
INTRAVENOUS | Status: DC | PRN
  Administered 2021-12-13: 400 mL via INTRAVENOUS

## 2021-12-13 MED ORDER — OXYCODONE HCL 5 MG/5ML PO SOLN: 5.0000 mg | Freq: Once | ORAL | Status: DC | PRN

## 2021-12-13 MED ORDER — IBUPROFEN 600 MG PO TABS
600.0000 mg | ORAL_TABLET | Freq: Four times a day (QID) | ORAL | Status: DC
  Administered 2021-12-13 – 2021-12-16 (×11): 600 mg via ORAL
  Filled 2021-12-13 (×11): qty 1

## 2021-12-13 MED ORDER — POVIDONE-IODINE 10 % EX SWAB
2.0000 | Freq: Once | CUTANEOUS | Status: AC
  Administered 2021-12-13: 2 via TOPICAL

## 2021-12-13 MED ORDER — PHENYLEPHRINE HCL-NACL 20-0.9 MG/250ML-% IV SOLN
INTRAVENOUS | Status: DC | PRN
  Administered 2021-12-13: 60 ug/min via INTRAVENOUS

## 2021-12-13 MED ORDER — DEXAMETHASONE SODIUM PHOSPHATE 4 MG/ML IJ SOLN
INTRAMUSCULAR | Status: AC
  Filled 2021-12-13: qty 2

## 2021-12-13 MED ORDER — METOCLOPRAMIDE HCL 5 MG/ML IJ SOLN
INTRAMUSCULAR | Status: AC
  Filled 2021-12-13: qty 2

## 2021-12-13 MED ORDER — CEFAZOLIN SODIUM-DEXTROSE 2-4 GM/100ML-% IV SOLN
INTRAVENOUS | Status: AC
  Filled 2021-12-13: qty 100

## 2021-12-13 MED ORDER — OXYCODONE HCL 5 MG PO TABS: 5.0000 mg | ORAL_TABLET | Freq: Once | ORAL | Status: DC | PRN

## 2021-12-13 MED ORDER — ZOLPIDEM TARTRATE 5 MG PO TABS: 5.0000 mg | ORAL_TABLET | Freq: Every evening | ORAL | Status: DC | PRN

## 2021-12-13 MED ORDER — DEXAMETHASONE SODIUM PHOSPHATE 4 MG/ML IJ SOLN
INTRAMUSCULAR | Status: DC | PRN
  Administered 2021-12-13: 8 mg via INTRAVENOUS

## 2021-12-13 MED ORDER — ONDANSETRON HCL 4 MG/2ML IJ SOLN
INTRAMUSCULAR | Status: AC
  Filled 2021-12-13: qty 2

## 2021-12-13 MED ORDER — WITCH HAZEL-GLYCERIN EX PADS
1.0000 | MEDICATED_PAD | CUTANEOUS | Status: DC | PRN
Start: 2021-12-13 — End: 2021-12-16

## 2021-12-13 MED ORDER — ONDANSETRON HCL 4 MG/2ML IJ SOLN: 4.0000 mg | Freq: Three times a day (TID) | INTRAMUSCULAR | Status: DC | PRN

## 2021-12-13 MED ORDER — DIPHENHYDRAMINE HCL 50 MG/ML IJ SOLN: 12.5000 mg | INTRAMUSCULAR | Status: DC | PRN

## 2021-12-13 MED ORDER — SODIUM CHLORIDE 0.9% FLUSH: 3.0000 mL | INTRAVENOUS | Status: DC | PRN

## 2021-12-13 MED ORDER — LACTATED RINGERS IV SOLN: INTRAVENOUS | Status: DC

## 2021-12-13 MED ORDER — LEVOTHYROXINE SODIUM 50 MCG PO TABS
50.0000 ug | ORAL_TABLET | Freq: Every day | ORAL | Status: DC
  Administered 2021-12-14 – 2021-12-16 (×3): 50 ug via ORAL
  Filled 2021-12-13 (×3): qty 1

## 2021-12-13 MED ORDER — FENTANYL CITRATE (PF) 100 MCG/2ML IJ SOLN
INTRAMUSCULAR | Status: AC
  Filled 2021-12-13: qty 2

## 2021-12-13 MED ORDER — NALOXONE HCL 4 MG/10ML IJ SOLN: 1.0000 ug/kg/h | INTRAVENOUS | Status: DC | PRN

## 2021-12-13 MED ORDER — SENNOSIDES-DOCUSATE SODIUM 8.6-50 MG PO TABS
2.0000 | ORAL_TABLET | Freq: Every day | ORAL | Status: DC
  Administered 2021-12-14 – 2021-12-16 (×3): 2 via ORAL
  Filled 2021-12-13 (×3): qty 2

## 2021-12-13 SURGICAL SUPPLY — 33 items
CHLORAPREP W/TINT 26ML (MISCELLANEOUS) ×4 IMPLANT
CLAMP CORD UMBIL (MISCELLANEOUS) ×2 IMPLANT
CLIP FILSHIE TUBAL LIGA STRL (Clip) IMPLANT
CLOTH BEACON ORANGE TIMEOUT ST (SAFETY) ×2 IMPLANT
DERMABOND ADVANCED (GAUZE/BANDAGES/DRESSINGS) ×2
DERMABOND ADVANCED .7 DNX12 (GAUZE/BANDAGES/DRESSINGS) IMPLANT
DRSG OPSITE POSTOP 4X10 (GAUZE/BANDAGES/DRESSINGS) ×2 IMPLANT
ELECT REM PT RETURN 9FT ADLT (ELECTROSURGICAL) ×2
ELECTRODE REM PT RTRN 9FT ADLT (ELECTROSURGICAL) ×1 IMPLANT
EXTRACTOR VACUUM M CUP 4 TUBE (SUCTIONS) IMPLANT
GLOVE BIO SURGEON STRL SZ7 (GLOVE) ×2 IMPLANT
GLOVE BIOGEL PI IND STRL 7.0 (GLOVE) ×1 IMPLANT
GLOVE BIOGEL PI INDICATOR 7.0 (GLOVE) ×1
GOWN STRL REUS W/TWL LRG LVL3 (GOWN DISPOSABLE) ×4 IMPLANT
KIT ABG SYR 3ML LUER SLIP (SYRINGE) IMPLANT
NDL HYPO 25X5/8 SAFETYGLIDE (NEEDLE) IMPLANT
NEEDLE HYPO 22GX1.5 SAFETY (NEEDLE) IMPLANT
NEEDLE HYPO 25X5/8 SAFETYGLIDE (NEEDLE) IMPLANT
NS IRRIG 1000ML POUR BTL (IV SOLUTION) ×2 IMPLANT
PACK C SECTION WH (CUSTOM PROCEDURE TRAY) ×2 IMPLANT
PAD OB MATERNITY 4.3X12.25 (PERSONAL CARE ITEMS) ×2 IMPLANT
RTRCTR C-SECT PINK 25CM LRG (MISCELLANEOUS) ×2 IMPLANT
SUT CHROMIC 1 CTX 36 (SUTURE) ×5 IMPLANT
SUT CHROMIC 2 0 CT 1 (SUTURE) ×2 IMPLANT
SUT PDS AB 0 CTX 60 (SUTURE) ×2 IMPLANT
SUT PLAIN 2 0 XLH (SUTURE) ×1 IMPLANT
SUT VIC AB 2-0 CT1 27 (SUTURE) ×1
SUT VIC AB 2-0 CT1 TAPERPNT 27 (SUTURE) ×1 IMPLANT
SUT VIC AB 4-0 KS 27 (SUTURE) IMPLANT
SYR 30ML LL (SYRINGE) IMPLANT
TOWEL OR 17X24 6PK STRL BLUE (TOWEL DISPOSABLE) ×3 IMPLANT
TRAY FOLEY W/BAG SLVR 14FR LF (SET/KITS/TRAYS/PACK) ×2 IMPLANT
WATER STERILE IRR 1000ML POUR (IV SOLUTION) ×2 IMPLANT

## 2021-12-13 NOTE — Anesthesia Postprocedure Evaluation (Signed)
Anesthesia Post Note  Patient: Sarah Whitney  Procedure(s) Performed: CESAREAN SECTION (Abdomen)     Patient location during evaluation: Mother Baby Anesthesia Type: Spinal Level of consciousness: oriented and awake and alert Pain management: pain level controlled Vital Signs Assessment: post-procedure vital signs reviewed and stable Respiratory status: spontaneous breathing and respiratory function stable Cardiovascular status: blood pressure returned to baseline and stable Postop Assessment: no headache, no backache, no apparent nausea or vomiting and able to ambulate Anesthetic complications: no   No notable events documented.  Last Vitals:  Vitals:   12/13/21 1552 12/13/21 1650  BP: 124/75 109/61  Pulse: 73 82  Resp: 18 18  Temp: (!) 36.4 C 36.9 C  SpO2: 97% 99%    Last Pain:  Vitals:   12/13/21 1650  TempSrc: Oral  PainSc: 2    Pain Goal:                Epidural/Spinal Function Cutaneous sensation: Able to Wiggle Toes (12/13/21 1650), Patient able to flex knees: Yes (12/13/21 1650), Patient able to lift hips off bed: Yes (12/13/21 1650), Back pain beyond tenderness at insertion site: No (12/13/21 1650), Progressively worsening motor and/or sensory loss: No (12/13/21 1650), Bowel and/or bladder incontinence post epidural: No (12/13/21 1650)  Trevor Iha

## 2021-12-13 NOTE — H&P (Addendum)
Sarah Whitney is a 31 y.o. female presenting for scheduled C/S  31 yo G3P1011 @ 37+4 presents for scheduled primary C/S for h/o prolonged shoulder dystocia with her first delivery. Her pregnancy has been complicated by gestational hypertension, maternal obesity, hypothyroidism. Echogenic bowel was noted on anatomy US at [facility] titers/CF all negative. Echogenic bowel resolved. OB History     Gravida  3   Para  1   Term  1   Preterm      AB  1   Living  1      SAB      IAB  1   Ectopic      Multiple      Live Births  1          Past Medical History:  Diagnosis Date   Allergy    Anxiety    Depression    GERD (gastroesophageal reflux disease)    Hypothyroidism    Pregnancy induced hypertension    Past Surgical History:  Procedure Laterality Date   NO PAST SURGERIES     Family History: family history includes Cancer in her mother; Heart disease in her paternal grandmother; Hypothyroidism in her father; Stroke in her paternal grandmother. Social History:  reports that she has never smoked. She has never used smokeless tobacco. She reports that she does not currently use alcohol. She reports that she does not use drugs.     Maternal Diabetes: No Genetic Screening: Normal Maternal Ultrasounds/Referrals: Echogenic bowel Fetal Ultrasounds or other Referrals:  Referred to Materal Fetal Medicine  Maternal Substance Abuse:  No Significant Maternal Medications:  None Significant Maternal Lab Results:  None Other Comments:   as above  Review of Systems History   Blood pressure (!) 149/80, pulse 80, temperature 98 F (36.7 C), temperature source Oral, resp. rate 15, height 5\' 4"  (1.626 m), weight 117.9 kg, last menstrual period 03/11/2021, SpO2 100 %. Exam Physical Exam  Prenatal labs: ABO, Rh: --/--/A POS Performed at [facility], [address], [place], [place]  [phone]/20 1011) Antibody: NEG (07/20 12-05-2005) Rubella: Immune (01/12 0000) RPR:  Nonreactive (01/12 0000)  HBsAg: Negative (01/12 0000)  HIV: Non-reactive (01/12 0000)  GBS:   Positive  Assessment/Plan: 1) Admit 2) Informed consent obtained, proceed with primary LTCS 3) SCDs for DVT prophylaxis 4) Ancef 3 gm OCTOR   11-03-1999 12/13/2021, 11:47 AM

## 2021-12-13 NOTE — Op Note (Signed)
Operative Report    Pre-Operative Diagnosis: 1) 37+4-week intrauterine pregnancy 2) pregnancy-induced hypertension 3) history of prior prolonged shoulder dystocia 4) maternal obesity  Postoperative Diagnosis: 1) 37+4-week intrauterine pregnancy 2) pregnancy-induced hypertension 3) history of prior prolonged shoulder dystocia 4) maternal obesity  Procedure: Primary low transverse cesarean section  Surgeon: Dr. Waynard Reeds  Assistant: None  Antibiotics: 3 g Ancef  Operative Findings: Vigorous female infant in the vertex presentation with Apgar scores of 8 at 1 minute and 8 at 5 minutes.  Normal-appearing ovaries and tubes.  Specimen: Placenta for disposal  EBL: Total I/O In: 1100 [I.V.:1000; IV Piggyback:100] Out: 1128 [Urine:400; Blood:728]   Procedure:Sarah Whitney is an 31 year old gravida 3 para 1011 at 58 weeks and 4 days estimated gestational age who presents for cesarean section. Following the appropriate informed consent the patient was brought to the operating room where spinal anesthesia was administered and found to be adequate. She was placed in the dorsal supine position with a leftward tilt. She was prepped and draped in the normal sterile fashion.  The patient was appropriately identified during a preoperative timeout procedure.  The scalpel was then used to make a Pfannenstiel skin incision which was carried down to the underlying layers of soft tissue to the fascia. The fascia was incised in the midline and the fascial incision was extended laterally with [place] scissors. The superior aspect of the fascial incision was grasped with Coker clamps x2, tented up and the rectus muscles dissected off sharply with the electrocautery unit. The same procedure was repeated on the inferior aspect of the fascial incision. The rectus muscles were separated in the midline. The abdominal peritoneum was identified, tented up, entered sharply, and the incision was extended superiorly and  inferiorly with good visualization of the bladder. The Alexis retractor was then deployed. The vesicouterine peritoneum was identified, tented up, entered sharply, and the bladder flap was created digitally.  The scalpel was then used to make a low transverse incision on the uterus which was extended laterally with blunt dissection. The fetal vertex was identified, delivered easily through the uterine incision followed by the body. The infant was bulb suctioned on the operative field and cried vigorously.  Following a 1 minute delay, the cord was clamped and cut. The infant was passed to the waiting neonatology team. Placenta was then delivered spontaneously and the uterus was cleared of all clot and debris. The uterine incision was repaired with #1 chromic in running locked fashion followed by a second imbricating layer. The ovaries and tubes were inspected and normal. The Alexis retractor was removed. The abdominal peritoneum was reapproximated with 2-0 Vicryl in a running fashion. The rectus muscles was reapproximated with 2-0 chromic in a running fashion. The fascia was closed with 0-looped PDS in a running fashion. The skin was closed with 4-0 vicryl in a subcuticular fashion and Dermabond. All laparotomy sponge, instrument, and needle counts were correct.  The patient tolerated the procedure well and was transferred to the recovery unit in stable condition following the procedure.

## 2021-12-13 NOTE — Transfer of Care (Signed)
Immediate Anesthesia Transfer of Care Note  Patient: Sarah Whitney  Procedure(s) Performed: CESAREAN SECTION (Abdomen)  Patient Location: PACU  Anesthesia Type:Spinal  Level of Consciousness: awake, alert  and oriented  Airway & Oxygen Therapy: Patient Spontanous Breathing  Post-op Assessment: Report given to RN and Post -op Vital signs reviewed and stable  Post vital signs: Reviewed and stable  Last Vitals:  Vitals Value Taken Time  BP 126/63 12/13/21 1415  Temp    Pulse 71 12/13/21 1417  Resp 19 12/13/21 1417  SpO2 99 % 12/13/21 1417  Vitals shown include unvalidated device data.  Last Pain:  Vitals:   12/13/21 1051  TempSrc: Oral         Complications: No notable events documented.

## 2021-12-13 NOTE — Lactation Note (Addendum)
This note was copied from a baby's chart. Lactation Consultation Note  Patient Name: Sarah Whitney [id number] Date: 12/13/2021 Reason for consult: Initial assessment;Early term 37-38.6wks (C/S delivery) See mom's MR: hx of GHTN, Hypothyroidism and C/S delivery.  Age:29 hours Per mom, infant has not latched since delivery. LC ask mom do breast stimulation, expressed few drops of colostrum prior to latching infant at the breast which help evert nipple shaft outward prior to latching infant. Mom latched infant on her right breast using the football hold position,  initially infant was on and off the breast but after 5 minutes infant started sustaining latch and BF for 12 minutes.   Mom was taught hand expression, LC used breast model and dad assisted with hand expression, infant was given 5 mls of colostrum by spoon. Mom will continue to breastfeed infant according to hunger cues, on demand, 8 to 12+ times within 24 hours, skin to skin. Mom will continue to work towards latching infant at the breast and will ask RN/LC for further latch assistance if needed. Mom knows if infant doesn't latch, she can hand express and give infant back her EBM by spoon. Mom made aware of O/P services, breastfeeding support groups, community resources, and our phone # for post-discharge questions.   Maternal Data Has patient been taught Hand Expression?: Yes Does the patient have breastfeeding experience prior to this delivery?: Yes How long did the patient breastfeed?: Per mom, 1st child did not latch was in NICU for 4 days.  Feeding Mother's Current Feeding Choice: Breast Milk  LATCH Score Latch: Repeated attempts needed to sustain latch, nipple held in mouth throughout feeding, stimulation needed to elicit sucking reflex.  Audible Swallowing: A few with stimulation  Type of Nipple: Flat (Mom's nipples responds well to stimulation and will evert outward if mom expresses few drops of colostrum prior to latching  infant at the breast.)  Comfort (Breast/Nipple): Soft / non-tender  Hold (Positioning): Assistance needed to correctly position infant at breast and maintain latch.  LATCH Score: 6   Lactation Tools Discussed/Used    Interventions Interventions: Breast feeding basics reviewed;Assisted with latch;Skin to skin;Pre-pump if needed;Adjust position;Support pillows;Breast compression;Position options;Expressed milk;Education;LC Services brochure  Discharge Pump: Personal (Per mom, she has Spectra 2 DEBP at home.)  Consult Status Consult Status: Follow-up Date: 12/14/21 Follow-up type: In-patient    Danelle Earthly 12/13/2021, 9:49 PM

## 2021-12-14 LAB — CBC
HCT: 23.7 % — ABNORMAL LOW (ref 36.0–46.0)
Hemoglobin: 8.1 g/dL — ABNORMAL LOW (ref 12.0–15.0)
MCH: 27.9 pg (ref 26.0–34.0)
MCHC: 34.2 g/dL (ref 30.0–36.0)
MCV: 81.7 fL (ref 80.0–100.0)
Platelets: 222 10*3/uL (ref 150–400)
RBC: 2.9 MIL/uL — ABNORMAL LOW (ref 3.87–5.11)
RDW: 13.5 % (ref 11.5–15.5)
WBC: 17.1 10*3/uL — ABNORMAL HIGH (ref 4.0–10.5)
nRBC: 0 % (ref 0.0–0.2)

## 2021-12-14 LAB — BIRTH TISSUE RECOVERY COLLECTION (PLACENTA DONATION)

## 2021-12-14 MED ORDER — POLYSACCHARIDE IRON COMPLEX 150 MG PO CAPS
150.0000 mg | ORAL_CAPSULE | Freq: Every day | ORAL | Status: DC
  Administered 2021-12-14 – 2021-12-16 (×3): 150 mg via ORAL
  Filled 2021-12-14 (×3): qty 1

## 2021-12-14 NOTE — Lactation Note (Signed)
This note was copied from a baby's chart. Lactation Consultation Note  Patient Name: Sarah Whitney [id number]'[id number] Date: 12/14/2021 Reason for consult: Follow-up assessment;Early term 37-38.6wks;1st time breastfeeding Age:29 hours   LC Follow Up Consult:  Mother is beginning to supplement with donor breast milk.  RN obtained donor milk consent.  Educated mother on breast feeding, supplementing and pumping for 15 minutes every three hours.  Initiated the DEBP: #24 flanges are appropriate at this time.  Taught mother how to assess for correct flange size.  Reviewed pump parts, set up and cleaning.  Wash station prepared and explained to parents.  Mother was able to obtain a few colostrum drops with pumping.  Encouraged to finger feed any expressed drops to "[facility]."    Demonstrated the manual pump for nipple eversion, however, since mother will be pumping with the electric pump she will not have to use this pump unless desired.  Left the pump at bedside so she may practice using it if desired.  Father present.   Maternal Data    Feeding Mother's Current Feeding Choice: Breast Milk and Donor Milk  LATCH Score                    Lactation Tools Discussed/Used Tools: Pump Breast pump type: Double-Electric Breast Pump;Manual Pump Education: Setup, frequency, and cleaning;Milk Storage Reason for Pumping: Breast stimulation for supplementation Pumping frequency: Every three hours Pumped volume:  (Few drops)  Interventions    Discharge    Consult Status Consult Status: Follow-up Date: 12/15/21 Follow-up type: In-patient    Patrisha Hausmann R Lerlene Treadwell 12/14/2021, 11:39 AM

## 2021-12-14 NOTE — Social Work (Signed)
CSW received consult for hx of Anxiety and Depression.  CSW met with MOB to offer support and complete assessment.    CSW entered the room and and introduced CSW role and reason for visit. MOB was agreeable to speaking with CSW with FOB present. CSW observed MOB holding the infant and FOB supportively sitting next to her. CSW inquired about how MOB has been feeling since giving birth. MOB reported that she has been feeling good, stated "I was nervous about the csection but it went better than expected". CSW inquired about MOB's hx Anxiety and Depression. MOB reported she was diagnosed with Anxiety earlier in her life and then she experienced depression during her first pregnancy but did not have PPD. MOB also reported that she experienced depression during this most recent pregnancy. CSW inquired about medications or therapy. MOB stated that she was on Wellbutrin about 2 years ago but stopped taking it. MOB explained she has not been in therapy but is interested once they get settled at home.  CSW provided therapy resources. CSW inquired about how MOB copes with depression and anxiety. MOB reported she just reminds herself its temporary, stated she thinks her diagnosis are situational. MOB denied any SI or HI. MOB identified FOB and his mom as her supports.   CSW provided education regarding the baby blues period vs. perinatal mood disorders, discussed treatment and gave resources for mental health follow up if concerns arise.  CSW recommends self-evaluation during the postpartum time period using the New Mom Checklist from Postpartum Progress and encouraged MOB to contact a medical professional if symptoms are noted at any time.    MOB identified [facility] for infants follow up care. MOB reported they have an appointment tin Monday. CSW provided review of Sudden Infant Death Syndrome (SIDS) precautions. MOB reported they have all necessary item for the infant including a Bassinet where the baby  will sleep.  CSW identifies no further need for intervention and no barriers to discharge at this time.   [place] Social Worker [phone]

## 2021-12-14 NOTE — Lactation Note (Signed)
This note was copied from a baby's chart. Lactation Consultation Note  Patient Name: Sarah Whitney EBRAX'E Date: 12/14/2021 Reason for consult: Follow-up assessment;Early term 37-38.6wks;1st time breastfeeding Age:29 hours   P2 mother whose infant is now 6 hours old.  This is an early term infant at 37+4 weeks.  Mother's current feeding preference is breast.  RN requested latch assistance.  Taught mother hand expression and finger fed a few colostrum drops to "[name]."  Attempted to latch in the football hold to the left breast, however, "[name]" was too sleepy to open her mouth.  Reassurance given.    Encouraged mother to feed on cue or at least 8-12 times/24 hours.  Suggested lots of STS, breast massage and hand expression.  Mother may call for latch assistance as needed.  She would benefit from receiving a manual pump to help with nipple eversion.  Parents are exhausted and desire sleep now.   Maternal Data Has patient been taught Hand Expression?: Yes Does the patient have breastfeeding experience prior to this delivery?: No  Feeding Mother's Current Feeding Choice: Breast Milk  LATCH Score Latch: Too sleepy or reluctant, no latch achieved, no sucking elicited.  Audible Swallowing: None  Type of Nipple: Everted at rest and after stimulation (Short shafted)  Comfort (Breast/Nipple): Soft / non-tender  Hold (Positioning): Assistance needed to correctly position infant at breast and maintain latch.  LATCH Score: 5   Lactation Tools Discussed/Used    Interventions Interventions: Breast feeding basics reviewed;Assisted with latch;Skin to skin;Breast massage;Hand express;Breast compression;Position options;Support pillows;Adjust position;Education  Discharge Pump: Personal  Consult Status Consult Status: Follow-up Date: 12/15/21 Follow-up type: In-patient    Haley Fuerstenberg R Lavern Maslow 12/14/2021, 4:22 AM

## 2021-12-14 NOTE — Progress Notes (Signed)
POSTPARTUM POSTOP PROGRESS NOTE  POD #1  Subjective:  No acute events overnight.  Pt denies problems with ambulating, voiding or po intake.  She denies nausea or vomiting.  Pain is well controlled.  She has had flatus. She has not had bowel movement.  Lochia Minimal. Denies PreE precautions  Objective: Blood pressure 125/69, pulse 72, temperature 98.2 F (36.8 C), temperature source Oral, resp. rate 18, height 5\' 4"  (1.626 m), weight 117.9 kg, last menstrual period 03/11/2021, SpO2 99 %, unknown if currently breastfeeding.  Physical Exam:  General: alert, cooperative and no distress Lochia:normal flow Chest: CTAB Heart: RRR no m/r/g Abdomen: +BS, soft, nontender Uterine Fundus: firm, 1cm below umbilicus. Honeycomb dressing intact, neg drainage Extremities: neg edema, neg calf TTP BL, neg Homans BL  Recent Labs    12/13/21 0957 12/14/21 0517  HGB 10.4* 8.1*  HCT 31.2* 23.7*    Assessment/Plan:  ASSESSMENT: Sarah Whitney is a 31 y.o. 37 s/p PLTCS @ 104w4d for h/o prolonged shoulder dystocia. PNC c/b GHTN, BMI 47, hypothyroidism  Plan for discharge tomorrow, Breastfeeding, and Social Work consult HypoT - synthroid [redacted]w[redacted]d continue GHTN - BP normotensive, continue to monitor Postop - routine care plus postop Lovenox SQ given HTN, BMI and postop status   LOS: 1 day

## 2021-12-15 MED ORDER — FERROUS GLUCONATE 324 (38 FE) MG PO TABS
324.0000 mg | ORAL_TABLET | Freq: Two times a day (BID) | ORAL | Status: DC
  Administered 2021-12-15 – 2021-12-16 (×2): 324 mg via ORAL
  Filled 2021-12-15 (×3): qty 1

## 2021-12-15 NOTE — Progress Notes (Signed)
Patient is eating, limited ambulating, voiding.  Pain control is good. Patient is tired.  Vitals:   12/14/21 0525 12/14/21 1548 12/14/21 2307 12/15/21 0515  BP: 125/69 136/68 128/63 124/73  Pulse: 72 83 97 88  Resp: 18 18 18 18   Temp: 98.2 F (36.8 C) 98.3 F (36.8 C) 98.8 F (37.1 C) 98.5 F (36.9 C)  TempSrc: Oral Oral Oral Oral  SpO2: 99%   100%  Weight:      Height:        Fundus firm Inc: c/d/I Ext: +2 pedal edema, no calf tenderness  Lab Results  Component Value Date   WBC 17.1 (H) 12/14/2021   HGB 8.1 (L) 12/14/2021   HCT 23.7 (L) 12/14/2021   MCV 81.7 12/14/2021   PLT 222 12/14/2021    --/--/A POS Performed at [facility], [address], [place], [place]  (07/20 1011)  A/P Post op day #2 s/p primary c/s for h.o shoulder dystocia, in setting ob morbid obesity Pt on prophylactic lovenox postoperatively, encourage OOB and elevation of feet Acute blood loss anemia- iron bid Pt would like to stay add'l day to improve ambulation  Routine care.  Expect d/c.    12-05-2005

## 2021-12-15 NOTE — Lactation Note (Signed)
This note was copied from a baby's chart. Lactation Consultation Note  Patient Name: [name] Date: 12/15/2021 Reason for consult: Follow-up assessment;Early term 71-38.6wks Age:29 hours   P2 mother whose infant is now 6 hours old.  This is an early term infant at 37+4 weeks with an 8% weight loss this morning.  Arrived to find "[name]" at the breast, however, not sucking effectively.  Mother reported that she had fed for 10 minutes prior to my arrival.  Educated mother on nutritive vs non-nutritive sucking.  Offered to assist; mother receptive.  "[name]" latched easily but would only take a few sucks before becoming too tired to continue.  Repeated a few times with the same results.  Suggested father begin supplementation and mother pump.  Asked parents to increase supplementation volumes to 30+ mls/feeding.  Discussed the 8% weight loss this morning and how to best help "[facility]" with feedings and less weight loss.  Parents verbalized understanding.  Observed mother pumping with the #24 flanges which are still an appropriate fit.  Mother needed no review on the pump set up or cleaning.  She has only obtained drops so far; reassurance provided.    Mother will continue to breast feed "[name]" followed by father giving the formula while mother pumps.  Asked mother to spend no more than 5-10 minutes attempting to latch if "[name]" does not show an interest.  Discussed calorie expenditure and how to limit further weight loss.  Mother will call for latch assistance as needed.   Maternal Data    Feeding Mother's Current Feeding Choice: Breast Milk and Donor Milk Nipple Type: Slow - flow  LATCH Score Latch: Repeated attempts needed to sustain latch, nipple held in mouth throughout feeding, stimulation needed to elicit sucking reflex.  Audible Swallowing: None  Type of Nipple: Everted at rest and after stimulation  Comfort (Breast/Nipple): Soft / non-tender  Hold  (Positioning): Assistance needed to correctly position infant at breast and maintain latch.  LATCH Score: 6   Lactation Tools Discussed/Used Tools: Pump;Flanges Flange Size: 24 Breast pump type: Double-Electric Breast Pump;Manual Pump Education: Setup, frequency, and cleaning (No review needed) Pumping frequency: Every three hours Pumped volume:  (Drops)  Interventions Interventions: Breast feeding basics reviewed;Assisted with latch;Skin to skin;Breast massage;Breast compression;Position options;Support pillows;Adjust position;Education  Discharge Pump: Personal  Consult Status Consult Status: Follow-up Date: 12/16/21 Follow-up type: In-patient    Raffaela Ladley R Jakorey Mcconathy 12/15/2021, 1:30 PM

## 2021-12-16 MED ORDER — OXYCODONE HCL 5 MG PO TABS: 5.0000 mg | ORAL_TABLET | ORAL | 0 refills | Status: DC | PRN

## 2021-12-16 NOTE — Lactation Note (Signed)
This note was copied from a baby's chart. Lactation Consultation Note  Patient Name: Girl Kadesha Virrueta EFEOF'H Date: 12/16/2021   Age:29 hours  LC attempted to visit with mom, but RN was in the room.   Maternal Data    Feeding Nipple Type: Slow - flow  LATCH Score                    Lactation Tools Discussed/Used    Interventions    Discharge    Consult Status      Orvil Feil Avrie Kedzierski 12/16/2021, 11:56 AM

## 2021-12-16 NOTE — Lactation Note (Signed)
This note was copied from a baby's chart. Lactation Consultation Note  Patient Name: Sarah Whitney IEPPI'R Date: 12/16/2021 Reason for consult: Follow-up assessment;Difficult latch;Early term 37-38.6wks;Infant weight loss;Breastfeeding assistance (8.77% WL) Age:29 hours  P2, Early Term, Infant Female, 8.77% WL  LC entered the room and baby was in the bassinet. Per mom, things are getting better with breastfeeding, but baby is not latching deeply enough. Mom states that baby has been mostly on the nipple and she is starting to feel sore. According to mom, baby recently ate 55mL of formula at 1100 and 85mL of expressed breast milk at 1010. LC did not get to observe the latch due to baby being full and asleep.   LC spoke with mom about having the outpatient LC contact her. Mom receptive and an email was sent to the outpatient LC.   LC spoke with mom about engorgement, warning signs, infant I/O, and outpatient services.   Mom says that she has no further questions or concerns.   Current Feeding Plan:  Breastfeed baby 8+ times in 24 hours according to feeding cues.  Put baby to the breast before supplementing. Supplement according to supplementation guidelines.  Pump and feed expressed breast milk to baby via a bottle.  Watch infant output and call the pediatrician with questions or concerns.  Call the outpatient [facility] for assistance with breastfeeding.    Feeding Nipple Type: Slow - flow  LATCH Score                    Lactation Tools Discussed/Used    Interventions Interventions: Breast feeding basics reviewed;Education  Discharge Discharge Education: Engorgement and breast care;Warning signs for feeding baby;Outpatient recommendation  Consult Status Consult Status: Complete Date: 12/16/21 Follow-up type: Call as needed    Delene Loll 12/16/2021, 12:20 PM

## 2021-12-16 NOTE — Discharge Summary (Signed)
Postpartum Discharge Summary     Patient Name: Sarah Whitney DOB: 1990/09/08 MRN: [id number]  Date of admission: 12/13/2021 Delivery date:12/13/2021  Delivering provider: Vanessa Kick  Date of discharge: 12/16/2021  Admitting diagnosis: Cesarean delivery delivered [O82] Gestational hypertension, third trimester [O13.3] history of severe shoulder dystocia Intrauterine pregnancy: [redacted]w[redacted]d    Secondary diagnosis:  Principal Problem:   Cesarean delivery delivered Active Problems:   Gestational hypertension, third trimester  Additional problems: morbid obesity, hypothyroidism    Discharge diagnosis: Term Pregnancy Delivered and Anemia       (acute blood loss)                                        Post partum procedures: none Augmentation: N/A Complications: None  Hospital course: Sceduled C/S   31y.o. yo [id number] admitted to the hospital 12/13/2021 for scheduled cesarean section with the following indication:Elective Primary and history of severe shoulder dystocia .Delivery details are as follows:  Membrane Rupture Time/Date: 1:19 PM ,12/13/2021   Delivery Method:C-Section, Low Transverse  Details of operation can be found in separate operative note.  Patient had an uncomplicated postpartum course.  She is ambulating, tolerating a regular diet, passing flatus, and urinating well. Patient is discharged home in stable condition on  12/16/21        Newborn Data: Birth date:12/13/2021  Birth time:1:20 PM  Gender:Female  Living status:Living  Apgars:8 ,8  Weight:3490 g     Magnesium Sulfate received: No BMZ received: No Rhophylac:N/A MMR:N/A T-DaP: see office note Flu: N/A Transfusion:No  Physical exam  Vitals:   12/15/21 0515 12/15/21 1400 12/15/21 2015 12/16/21 0612  BP: 124/73 126/73 135/62 115/72  Pulse: 88 90 66 74  Resp: _0 Temp: 98.5 F (36.9 C) 98.3 F (36.8 C) 98.1 F (36.7 C) 98 F (36.7 C)  TempSrc: Oral Oral Oral Oral  SpO2: 100%  97%  97%  Weight:      Height:       General: alert, cooperative, and no distress Lochia: appropriate Uterine Fundus: firm Incision: Healing well with no significant drainage, No significant erythema, Dressing is clean, dry, and intact DVT Evaluation: No evidence of DVT seen on physical exam. Labs: Lab Results  Component Value Date   WBC 17.1 (H) 12/14/2021   HGB 8.1 (L) 12/14/2021   HCT 23.7 (L) 12/14/2021   MCV 81.7 12/14/2021   PLT 222 12/14/2021      Latest Ref Rng & Units 12/13/2021    9:57 AM  CMP  Glucose 70 - 99 mg/dL 79   BUN 6 - 20 mg/dL 10   Creatinine 0.44 - 1.00 mg/dL 0.99   Sodium 135 - 145 mmol/L 138   Potassium 3.5 - 5.1 mmol/L 3.6   Chloride 98 - 111 mmol/L 111   CO2 22 - 32 mmol/L 19   Calcium 8.9 - 10.3 mg/dL 9.4   Total Protein 6.5 - 8.1 g/dL 6.4   Total Bilirubin 0.3 - 1.2 mg/dL 0.7   Alkaline Phos 38 - 126 U/L 90   AST 15 - 41 U/L 13   ALT 0 - 44 U/L 11    Edinburgh Score:    12/13/2021    5:49 PM  Edinburgh Postnatal Depression Scale Screening Tool  I have been able to laugh and see the funny side of things. 0  I  have looked forward with enjoyment to things. 0  I have blamed myself unnecessarily when things went wrong. 2  I have been anxious or worried for no good reason. 2  I have felt scared or panicky for no good reason. 2  Things have been getting on top of me. 2  I have been so unhappy that I have had difficulty sleeping. 0  I have felt sad or miserable. 1  I have been so unhappy that I have been crying. 1  The thought of harming myself has occurred to me. 0  Edinburgh Postnatal Depression Scale Total 10      After visit meds:  Allergies as of 12/16/2021       Reactions   Cat Hair Extract         Medication List     TAKE these medications    albuterol 108 (90 Base) MCG/ACT inhaler Commonly known as: VENTOLIN HFA TAKE 2 PUFFS BY MOUTH EVERY 6 HOURS AS NEEDED FOR WHEEZE OR SHORTNESS OF BREATH   levothyroxine 50 MCG  tablet Commonly known as: SYNTHROID TAKE 1 TABLET BY MOUTH EVERY DAY BEFORE BREAKFAST   oxyCODONE 5 MG immediate release tablet Commonly known as: Oxy IR/ROXICODONE Take 1-2 tablets (5-10 mg total) by mouth every 4 (four) hours as needed for moderate pain.   PRENATAL GUMMIES PO Take by mouth.               Discharge Care Instructions  (From admission, onward)           Start     Ordered   12/16/21 0000  Discharge wound care:       Comments: Can remove dressing and steri strips 5 days from date of surgery   12/16/21 0741             Discharge home in stable condition Infant Feeding:  see peds note Infant Disposition:home with mother Discharge instruction: per After Visit Summary and Postpartum booklet. Activity: Advance as tolerated. Pelvic rest for 6 weeks.  Diet: routine diet Anticipated Birth Control: Unsure Postpartum Appointment: 2-4 weeks for incision check Additional Postpartum F/U: Postpartum Depression checkup and Incision check as above Future Appointments:No future appointments. Follow up Visit:  Follow-up Information     Vanessa Kick, MD. Call.   Specialty: Obstetrics and Gynecology Why: schedule follow up visit in 2-4 weeks at provider's discretion Contact information: [address] [place] [address] [phone]                     12/16/2021 Allyn Kenner, DO

## 2021-12-17 ENCOUNTER — Telehealth (HOSPITAL_COMMUNITY): Payer: Self-pay | Admitting: *Deleted

## 2021-12-17 NOTE — Telephone Encounter (Signed)
Patient's inpatient EPDS score on 12/13/21 = 10. Score faxed to Dr. Waynard Reeds. Deforest Hoyles, RN, 12/17/21, [phone]

## 2021-12-24 ENCOUNTER — Encounter (HOSPITAL_COMMUNITY)
Admission: AD | Disposition: A | Discharge status: Home / Self Care | Payer: Self-pay | Source: Home / Self Care | Attending: Obstetrics and Gynecology

## 2021-12-24 SURGERY — Surgical Case
Anesthesia: Regional

## 2021-12-30 ENCOUNTER — Encounter (HOSPITAL_COMMUNITY): Payer: Self-pay | Admitting: Obstetrics and Gynecology

## 2022-06-28 ENCOUNTER — Encounter: Payer: Self-pay | Admitting: Family Medicine

## 2022-06-28 ENCOUNTER — Ambulatory Visit (INDEPENDENT_AMBULATORY_CARE_PROVIDER_SITE_OTHER): Payer: 59 | Admitting: Family Medicine

## 2022-06-28 VITALS — BP 126/80 | HR 82 | Temp 98.3°F | Resp 12 | Ht 64.0 in | Wt 261.4 lb

## 2022-06-28 DIAGNOSIS — E039 Hypothyroidism, unspecified: Secondary | ICD-10-CM | POA: Diagnosis not present

## 2022-06-28 DIAGNOSIS — F330 Major depressive disorder, recurrent, mild: Secondary | ICD-10-CM | POA: Diagnosis not present

## 2022-06-28 DIAGNOSIS — F902 Attention-deficit hyperactivity disorder, combined type: Secondary | ICD-10-CM

## 2022-06-28 MED ORDER — AMPHETAMINE-DEXTROAMPHET ER 20 MG PO CP24: 20.0000 mg | ORAL_CAPSULE | ORAL | 0 refills | Status: DC

## 2022-06-28 NOTE — Patient Instructions (Addendum)
A few things to remember from today's visit:  Hypothyroidism, unspecified type - Plan: TSH  Attention deficit hyperactivity disorder (ADHD), combined type  Resume Adderall same dose. If you need refills for medications you take chronically, please call your pharmacy. Do not use My Chart to request refills or for acute issues that need immediate attention. If you send a my chart message, it may take a few days to be addressed, specially if I am not in the office.  Please be sure medication list is accurate. If a new problem present, please set up appointment sooner than planned today.

## 2022-06-28 NOTE — Progress Notes (Unsigned)
HPI: [name] is a 32 y.o. female, who is here today to follow on medication.  recheck of her thyroid and to resume medication for ADHD. She reports that during her pregnancy, her OB's office monitored her thyroid and took her off thyroid medication. Postpartum, her thyroid levels indicated hyperthyroidism, and she has an appointment with an endocrinologist in July 2024. The patient previously took Adderall XR 20 mg for ADHD and found it helpful without any side effects. She also tried Ritalin, which was not effective.  The patient experienced postpartum depression and was prescribed Wellbutrin for a few months. She stopped taking it in January and does not feel the need to continue. She reports sleeping an average of seven to eight hours per night, although it is interrupted due to caring for her baby. The patient is not breastfeeding and is currently using oral contraceptives for birth control. She reports cooking at home and transitioning to a healthier diet, with a focus on reducing meat consumption. The patient is not intentionally exercising but takes occasional walks with her baby. In 02/2020 recommended Ritalin 20 mg, which she felt he did not help. She also try Adderall XR 20 mg daily. ***  Reported on 03/09/20: During high school she felt like she had ADHD.   States that she was tested by pediatrician but she felt like her symptoms were disregarded. She has been dxed with generalized anxiety disorder. She has not seen psychiatrics of psychologist, she is interested in establishing care.   Has dropped out of college a few times, she cannot keep up with assignments, easily distracted. Disorganized, problems prioritizing what needed to be done, behind on work.   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 She has difficulty with completing chores, starts many at the dame time and ends up not completing anyone. She is not encouraged to go back to school because she doe snot feel she will  succeed.   She is not working at this time. *** Hypothyroidism: She has not been on Levothyroxine. 11/2022 for hyperthyroidism. *** Lab Results  Component Value Date   TSH 3.59 02/18/2020   Review of Systems See other pertinent positives and negatives in HPI.  Current Outpatient Medications on File Prior to Visit  Medication Sig Dispense Refill   albuterol (VENTOLIN HFA) 108 (90 Base) MCG/ACT inhaler TAKE 2 PUFFS BY MOUTH EVERY 6 HOURS AS NEEDED FOR WHEEZE OR SHORTNESS OF BREATH 8.5 each 0   Prenatal MV & Min w/FA-DHA (PRENATAL GUMMIES PO) Take by mouth.     No current facility-administered medications on file prior to visit.    Past Medical History:  Diagnosis Date   Allergy    Anxiety    Depression    GERD (gastroesophageal reflux disease)    Hypothyroidism    Pregnancy induced hypertension    Allergies  Allergen Reactions   Cat Hair Extract     Social History   Socioeconomic History   Marital status: Married    Spouse name: Not on file   Number of children: Not on file   Years of education: Not on file   Highest education level: Not on file  Occupational History   Not on file  Tobacco Use   Smoking status: Never   Smokeless tobacco: Never  Vaping Use   Vaping Use: Never used  Substance and Sexual Activity   Alcohol use: Not Currently   Drug use: Never   Sexual activity: Yes  Other Topics Concern   Not on file  Social History Narrative   Not on file   Social Determinants of Health   Financial Resource Strain: Not on file  Food Insecurity: Not on file  Transportation Needs: Not on file  Physical Activity: Not on file  Stress: Not on file  Social Connections: Not on file   Vitals:   06/28/22 1547  BP: 126/80  Pulse: 82  Temp: 98.3 F (36.8 C)  SpO2: 95%   Body mass index is 44.86 kg/m.  Physical Exam Vitals and nursing note reviewed.  Constitutional:      General: She is not in acute distress.    Appearance: She is well-developed.   HENT:     Head: Normocephalic and atraumatic.     Mouth/Throat:     Mouth: Mucous membranes are moist.     Pharynx: Oropharynx is clear.  Eyes:     Conjunctiva/sclera: Conjunctivae normal.  Cardiovascular:     Rate and Rhythm: Normal rate and regular rhythm.     Pulses:          Dorsalis pedis pulses are 2+ on the right side and 2+ on the left side.     Heart sounds: No murmur heard. Pulmonary:     Effort: Pulmonary effort is normal. No respiratory distress.     Breath sounds: Normal breath sounds.  Abdominal:     Palpations: Abdomen is soft. There is no hepatomegaly or mass.     Tenderness: There is no abdominal tenderness.  Lymphadenopathy:     Cervical: No cervical adenopathy.  Skin:    General: Skin is warm.     Findings: No erythema or rash.  Neurological:     General: No focal deficit present.     Mental Status: She is alert and oriented to person, place, and time.     Cranial Nerves: No cranial nerve deficit.     Gait: Gait normal.  Psychiatric:     Comments: Well groomed, good eye contact.     ASSESSMENT AND PLAN:  [name] was seen today for hypothyroidism and adhd.  Diagnoses and all orders for this visit:  Hypothyroidism, unspecified type -     TSH; Future  Attention deficit hyperactivity disorder (ADHD), combined type  Other orders -     amphetamine-dextroamphetamine (ADDERALL XR) 20 MG 24 hr capsule; Take 1 capsule (20 mg total) by mouth every morning.    Orders Placed This Encounter  Procedures   TSH    No problem-specific Assessment & Plan notes found for this encounter.   Return in about 2 months (around 08/27/2022) for chronic problems.  [name] G. [place], MD  [place]. [place] office.

## 2022-06-29 ENCOUNTER — Encounter: Payer: Self-pay | Admitting: Family Medicine

## 2022-06-29 LAB — TSH: TSH: 5.12 mIU/L — ABNORMAL HIGH

## 2022-06-29 NOTE — Assessment & Plan Note (Signed)
Reporting improvement, she does not feel like pharmacologic treatment is needed at this time.

## 2022-06-29 NOTE — Assessment & Plan Note (Signed)
She is not longer on Levothyroxine. Reporting abnormal TSH suggestive for hyperthyroidism, she was referred to endocrinologist and has an appt in 11/2022. TSH ordered today.

## 2022-06-29 NOTE — Assessment & Plan Note (Signed)
Adderall XR 20 mg was helping, so resume it. Some side effects of medication discussed. PMP reviewed. Medication contract next visit. F/U in 4 months, before if needed.

## 2022-06-29 NOTE — Assessment & Plan Note (Signed)
She understands the benefits of wt loss as well as adverse effects of obesity. Consistency with healthy diet and physical activity encouraged. 

## 2022-07-29 ENCOUNTER — Other Ambulatory Visit: Payer: Self-pay | Admitting: Family Medicine

## 2022-07-29 DIAGNOSIS — F902 Attention-deficit hyperactivity disorder, combined type: Secondary | ICD-10-CM

## 2022-07-29 MED ORDER — AMPHETAMINE-DEXTROAMPHET ER 20 MG PO CP24: 20.0000 mg | ORAL_CAPSULE | ORAL | 0 refills | Status: DC

## 2022-08-26 NOTE — Progress Notes (Unsigned)
HPI: Sarah Whitney is a 32 y.o. female, who is here today for chronic disease management.  Last seen on 06/28/22 She has a few ongoing concerns.   -ADHD: She is on Adderall XR 20 mg daily, she reports that it is still helping with concentration and task completion. She mentions an increase in fingernail biting, which is more than usual.  She denies any aggravation of anxiety or depression due to the medication and confirms taking it usually on weekdays and sometimes on Saturdays for productivity but not on Sundays.  -She has been experiencing "stomach issues", describing them as severe periumbilical abdominal cramps and urgency to have a bowel movement, occurring 3-5 days in a row and after eating, with bowel movements being soft or sometimes watery, happening up to five times a day. These symptoms have been present for a while but worse after pregnancy. Episodes occurring at least twice a month without a clear trigger from food types.  Problem seems to be exacerbated by anxiety. Started a daily probiotic a few weeks ago. She is concerned about possible malignancy. Negative for abnormal wt loss, night sweats,melena,nausea,vomiting,or blood in stool.  Anxiety  has been a lifelong issue. Previously, she took Lexapro for anxiety, which was helpful.  She also mentions irregular menstrual periods since starting a new birth control, which was expected to halt periods but instead has led to frequent bleeding.She has not contacted her gynecologist. Bleeding is not heavy.  Fatigue. Sleeps at least 8 hours, does not feel rested. Takes naps sometimes. No known Hx of OSA. Currently, she is not engaging in dedicated workouts but is active with childcare.  Her appetite has decreased since starting Adderall, has decreased portions and eating just 2 times daily but still has not noted significant wt loss.  Hypothyroidism: She has not been on levothyroxine for several months. Negative for cold or heat  intolerance.  Lab Results  Component Value Date   TSH 5.12 (H) 06/28/2022   Iron deficiency anemia during pregnancy. She is not on iron supplementation.  Lab Results  Component Value Date   WBC 17.1 (H) 12/14/2021   HGB 8.1 (L) 12/14/2021   HCT 23.7 (L) 12/14/2021   MCV 81.7 12/14/2021   PLT 222 12/14/2021   Lab Results  Component Value Date   CREATININE 0.99 12/13/2021   BUN 10 12/13/2021   NA 138 12/13/2021   K 3.6 12/13/2021   CL 111 12/13/2021   CO2 19 (L) 12/13/2021   Review of Systems  Constitutional:  Positive for fatigue.  HENT:  Negative for sore throat and trouble swallowing.   Respiratory:  Negative for cough, shortness of breath and wheezing.   Cardiovascular:  Negative for chest pain, palpitations and leg swelling.  Endocrine: Negative for cold intolerance and heat intolerance.  Genitourinary:  Negative for decreased urine volume, dysuria and hematuria.  Musculoskeletal:  Negative for gait problem and myalgias.  Skin:  Negative for rash.  Neurological:  Negative for syncope and headaches.  Psychiatric/Behavioral:  Negative for confusion and hallucinations.   See other pertinent positives and negatives in HPI.  Current Outpatient Medications on File Prior to Visit  Medication Sig Dispense Refill   albuterol (VENTOLIN HFA) 108 (90 Base) MCG/ACT inhaler TAKE 2 PUFFS BY MOUTH EVERY 6 HOURS AS NEEDED FOR WHEEZE OR SHORTNESS OF BREATH 8.5 each 0   Prenatal MV & Min w/FA-DHA (PRENATAL GUMMIES PO) Take by mouth.     No current facility-administered medications on file prior to visit.   Past  Medical History:  Diagnosis Date   Allergy    Anxiety    Depression    GERD (gastroesophageal reflux disease)    Hypothyroidism    Pregnancy induced hypertension    Allergies  Allergen Reactions   Cat Hair Extract     Social History   Socioeconomic History   Marital status: Married    Spouse name: Not on file   Number of children: Not on file   Years of  education: Not on file   Highest education level: Not on file  Occupational History   Not on file  Tobacco Use   Smoking status: Never   Smokeless tobacco: Never  Vaping Use   Vaping Use: Never used  Substance and Sexual Activity   Alcohol use: Not Currently   Drug use: Never   Sexual activity: Yes  Other Topics Concern   Not on file  Social History Narrative   Not on file   Social Determinants of Health   Financial Resource Strain: Not on file  Food Insecurity: Not on file  Transportation Needs: Not on file  Physical Activity: Not on file  Stress: Not on file  Social Connections: Not on file   Vitals:   08/27/22 1525  BP: 124/80  Pulse: 82  Resp: 12  Temp: 97.9 F (36.6 C)  SpO2: 98%   Body mass index is 44.48 kg/m.  Physical Exam Vitals and nursing note reviewed.  Constitutional:      General: She is not in acute distress.    Appearance: She is well-developed.  HENT:     Head: Normocephalic and atraumatic.  Eyes:     Conjunctiva/sclera: Conjunctivae normal.  Neck:     Thyroid: No thyroid mass.  Cardiovascular:     Rate and Rhythm: Normal rate and regular rhythm.     Heart sounds: No murmur heard. Pulmonary:     Effort: Pulmonary effort is normal. No respiratory distress.     Breath sounds: Normal breath sounds.  Abdominal:     Palpations: Abdomen is soft. There is no hepatomegaly or mass.     Tenderness: There is no abdominal tenderness.  Musculoskeletal:     Right lower leg: No edema.     Left lower leg: No edema.  Skin:    General: Skin is warm.     Findings: No erythema or rash.  Neurological:     General: No focal deficit present.     Mental Status: She is alert and oriented to person, place, and time.     Gait: Gait normal.  Psychiatric:        Mood and Affect: Affect normal. Mood is anxious.        Thought Content: Thought content does not include suicidal ideation. Thought content does not include suicidal plan.   ASSESSMENT AND  PLAN:  Elesa was seen today for medical management of chronic issues.  Diagnoses and all orders for this visit:  Attention deficit hyperactivity disorder (ADHD), combined type Assessment & Plan: Adderall XR 20 mg is helping, so no changes. Some side effects of medication discussed. PMP reviewed. Medication contract signed today. F/U in 3 months, before if needed.  Orders: -     Amphetamine-Dextroamphet ER; Take 1 capsule (20 mg total) by mouth every morning.  Dispense: 30 capsule; Refill: 0  Hypothyroidism, unspecified type Assessment & Plan: Currently she is not on hormonal therapy. Last TSH minimally elevated, 5.1, I do not think this is causing her fatigue. Labs repeated today, further  recommendation will be given according to lab results.  Orders: -     TSH; Future -     T4, free; Future  GAD (generalized anxiety disorder) Assessment & Plan: She feels like problem is getting gradually worse. In the past she has tried Lexapro, which helped, she agrees to resume medication at 10 mg daily. We discussed some side effects. Follow-up in 3 months, before if needed.  Orders: -     Escitalopram Oxalate; Take 1 tablet (10 mg total) by mouth daily.  Dispense: 30 tablet; Refill: 1  Irritable bowel syndrome with diarrhea Assessment & Plan: Symptoms she reported today are suggestive of IBS-D. We discussed differential diagnosis. She is concerned about possible "cancer."  Explained that the probability of  her symptoms being caused by a serious process is low, GI referral offered, she prefers to hold on this for now. Anxiety seems to be an aggravating factor, Lexapro may help. Recommend Bentyl 10 mg before meals as needed.  We discussed some side effects. Monitor for new symptoms. Instructed about warning signs.  Orders: -     Dicyclomine HCl; Take 1 capsule (10 mg total) by mouth 4 (four) times daily -  before meals and at bedtime.  Dispense: 30 capsule; Refill: 2  Fatigue,  unspecified type Assessment & Plan: We discussed possible etiologies. Some of her chronic medical problems can be contributing factors. Consistency with following a healthful diet and engaging in regular physical activity may help. Further recommendation will be given according to lab results.  Orders: -     Basic metabolic panel; Future -     CBC; Future  Iron deficiency anemia, unspecified iron deficiency anemia type Assessment & Plan: She is not on iron supplementation. Can be a contributing factor for her fatigue. Furthere commendations according to cbc and iron studies.  Orders: -     CBC; Future -     Ferritin; Future -     Iron; Future  I spent a total of 43 minutes in both face to face and non face to face activities for this visit on the date of this encounter. During this time history was obtained and documented, examination was performed, prior labs reviewed, and assessment/plan discussed.  Return in about 3 months (around 11/26/2022) for chronic problems.  Hassen Bruun G. [place], MD  [facility]. [place] office.

## 2022-08-27 ENCOUNTER — Ambulatory Visit (INDEPENDENT_AMBULATORY_CARE_PROVIDER_SITE_OTHER): Payer: 59 | Admitting: Family Medicine

## 2022-08-27 ENCOUNTER — Encounter: Payer: Self-pay | Admitting: Family Medicine

## 2022-08-27 VITALS — BP 124/80 | HR 82 | Temp 97.9°F | Resp 12 | Ht 64.0 in | Wt 259.1 lb

## 2022-08-27 DIAGNOSIS — D509 Iron deficiency anemia, unspecified: Secondary | ICD-10-CM

## 2022-08-27 DIAGNOSIS — F411 Generalized anxiety disorder: Secondary | ICD-10-CM | POA: Diagnosis not present

## 2022-08-27 DIAGNOSIS — K580 Irritable bowel syndrome with diarrhea: Secondary | ICD-10-CM | POA: Diagnosis not present

## 2022-08-27 DIAGNOSIS — R5383 Other fatigue: Secondary | ICD-10-CM | POA: Diagnosis not present

## 2022-08-27 DIAGNOSIS — F902 Attention-deficit hyperactivity disorder, combined type: Secondary | ICD-10-CM | POA: Diagnosis not present

## 2022-08-27 DIAGNOSIS — F330 Major depressive disorder, recurrent, mild: Secondary | ICD-10-CM

## 2022-08-27 DIAGNOSIS — E039 Hypothyroidism, unspecified: Secondary | ICD-10-CM | POA: Diagnosis not present

## 2022-08-27 MED ORDER — AMPHETAMINE-DEXTROAMPHET ER 20 MG PO CP24: 20.0000 mg | ORAL_CAPSULE | ORAL | 0 refills | Status: DC

## 2022-08-27 MED ORDER — ESCITALOPRAM OXALATE 10 MG PO TABS: 10.0000 mg | ORAL_TABLET | Freq: Every day | ORAL | 1 refills | Status: DC

## 2022-08-27 MED ORDER — DICYCLOMINE HCL 10 MG PO CAPS: 10.0000 mg | ORAL_CAPSULE | Freq: Three times a day (TID) | ORAL | 2 refills | Status: AC

## 2022-08-27 NOTE — Assessment & Plan Note (Signed)
She feels like problem is getting gradually worse. In the past she has tried Lexapro, which helped, she agrees to resume medication at 10 mg daily. We discussed some side effects. Follow-up in 3 months, before if needed.

## 2022-08-27 NOTE — Assessment & Plan Note (Signed)
Currently she is not on hormonal therapy. Last TSH minimally elevated, 5.1, I do not think this is causing her fatigue. Labs repeated today, further recommendation will be given according to lab results.

## 2022-08-27 NOTE — Patient Instructions (Addendum)
A few things to remember from today's visit:  Attention deficit hyperactivity disorder (ADHD), combined type - Plan: amphetamine-dextroamphetamine (ADDERALL XR) 20 MG 24 hr capsule  Hypothyroidism, unspecified type - Plan: TSH, T4, free  Depression, major, recurrent, mild  GAD (generalized anxiety disorder) - Plan: escitalopram (LEXAPRO) 10 MG tablet  Irritable bowel syndrome with diarrhea - Plan: dicyclomine (BENTYL) 10 MG capsule  Fatigue, unspecified type - Plan: Basic metabolic panel, CBC  Iron deficiency anemia, unspecified iron deficiency anemia type - Plan: CBC, Ferritin, Iron, CANCELED: Iron, CANCELED: Ferritin  Lexapro 10 mg started today. Bentyl to take before meals as needed for diarrhea and cramps. No changes in Adderall.  If you need refills for medications you take chronically, please call your pharmacy. Do not use My Chart to request refills or for acute issues that need immediate attention. If you send a my chart message, it may take a few days to be addressed, specially if I am not in the office.  Please be sure medication list is accurate. If a new problem present, please set up appointment sooner than planned today.

## 2022-08-27 NOTE — Assessment & Plan Note (Signed)
Adderall XR 20 mg is helping, so no changes. Some side effects of medication discussed. PMP reviewed. Medication contract signed today. F/U in 3 months, before if needed.

## 2022-08-27 NOTE — Assessment & Plan Note (Addendum)
We discussed possible etiologies. Some of her chronic medical problems can be contributing factors. Consistency with following a healthful diet and engaging in regular physical activity may help. Further recommendation will be given according to lab results.

## 2022-08-27 NOTE — Assessment & Plan Note (Signed)
She is not on iron supplementation. Can be a contributing factor for her fatigue. Furthere commendations according to cbc and iron studies.

## 2022-08-27 NOTE — Assessment & Plan Note (Addendum)
Symptoms she reported today are suggestive of IBS-D. We discussed differential diagnosis. She is concerned about possible "cancer."  Explained that the probability of  her symptoms being caused by a serious process is low, GI referral offered, she prefers to hold on this for now. Anxiety seems to be an aggravating factor, Lexapro may help. Recommend Bentyl 10 mg before meals as needed.  We discussed some side effects. Monitor for new symptoms. Instructed about warning signs.

## 2022-08-28 LAB — CBC
HCT: 41.7 % (ref 36.0–46.0)
Hemoglobin: 14.2 g/dL (ref 12.0–15.0)
MCHC: 34.1 g/dL (ref 30.0–36.0)
MCV: 83.6 fl (ref 78.0–100.0)
Platelets: 285 10*3/uL (ref 150.0–400.0)
RBC: 4.98 Mil/uL (ref 3.87–5.11)
RDW: 13 % (ref 11.5–15.5)
WBC: 10.5 10*3/uL (ref 4.0–10.5)

## 2022-08-28 LAB — BASIC METABOLIC PANEL
BUN: 13 mg/dL (ref 6–23)
CO2: 25 mEq/L (ref 19–32)
Calcium: 9.3 mg/dL (ref 8.4–10.5)
Chloride: 105 mEq/L (ref 96–112)
Creatinine, Ser: 1.15 mg/dL (ref 0.40–1.20)
GFR: 64.18 mL/min (ref >60.00)
Glucose, Bld: 95 mg/dL (ref 70–99)
Potassium: 3.8 mEq/L (ref 3.5–5.1)
Sodium: 139 mEq/L (ref 135–145)

## 2022-08-28 LAB — IRON: Iron: 63 ug/dL (ref 42–145)

## 2022-08-28 LAB — FERRITIN: Ferritin: 14.2 ng/mL (ref 10.0–291.0)

## 2022-08-28 LAB — TSH: TSH: 3.46 u[IU]/mL (ref 0.35–5.50)

## 2022-08-28 LAB — T4, FREE: Free T4: 0.81 ng/dL (ref 0.60–1.60)

## 2022-08-29 ENCOUNTER — Other Ambulatory Visit: Payer: Self-pay | Admitting: Family Medicine

## 2022-08-29 DIAGNOSIS — F902 Attention-deficit hyperactivity disorder, combined type: Secondary | ICD-10-CM

## 2022-08-30 NOTE — Telephone Encounter (Signed)
Pt needs a refill on Adderall sent to [facility] on [address].

## 2022-08-31 ENCOUNTER — Encounter: Payer: Self-pay | Admitting: Family Medicine

## 2022-09-30 ENCOUNTER — Other Ambulatory Visit: Payer: Self-pay | Admitting: Family Medicine

## 2022-09-30 DIAGNOSIS — F902 Attention-deficit hyperactivity disorder, combined type: Secondary | ICD-10-CM

## 2022-10-01 MED ORDER — AMPHETAMINE-DEXTROAMPHET ER 20 MG PO CP24: 20.0000 mg | ORAL_CAPSULE | ORAL | 0 refills | Status: DC

## 2022-10-29 ENCOUNTER — Other Ambulatory Visit: Payer: Self-pay | Admitting: Family Medicine

## 2022-10-29 DIAGNOSIS — F902 Attention-deficit hyperactivity disorder, combined type: Secondary | ICD-10-CM

## 2022-10-29 MED ORDER — AMPHETAMINE-DEXTROAMPHET ER 20 MG PO CP24: 20.0000 mg | ORAL_CAPSULE | ORAL | 0 refills | Status: DC

## 2022-10-29 NOTE — Telephone Encounter (Signed)
Last OV 08/2022 Last filled 10/01/22 Can you advise in pcp's absence?

## 2022-10-30 ENCOUNTER — Other Ambulatory Visit: Payer: Self-pay | Admitting: Family Medicine

## 2022-10-30 DIAGNOSIS — F411 Generalized anxiety disorder: Secondary | ICD-10-CM

## 2022-10-31 NOTE — Telephone Encounter (Signed)
Notes from last OV on 08/27/22:  GAD (generalized anxiety disorder) Assessment & Plan: She feels like problem is getting gradually worse. In the past she has tried Lexapro, which helped, she agrees to resume medication at 10 mg daily. We discussed some side effects. Follow-up in 3 months, before if needed.

## 2022-11-25 NOTE — Progress Notes (Unsigned)
HPI: Sarah Whitney is a 32 y.o. female, who is here today for chronic disease management.  Last seen on 08/27/22. No new problems since her last visit.  ADHD: She is on Adderall XR 20 mg daily. Medication is still helping with concentration and task completion. Takes medication almost daily. Negative for headache,palpitations, tremors, or changes in appetite.  Anxiety: Lexapro 10 mg started last visit. She reports a significant improvement in anxiety since starting Lexapro, noting that she was previously angry and anxious without realizing it was not normal. She mentions that she has not made significant changes to her diet and still does not eat very much. She reports that her fatigue has remained the same, and she attributes it to her baby waking up once or twice a night and her current medication regimen.  She acknowledges that she needs to make healthier choices to improve her overall well-being.  Lab Results  Component Value Date   WBC 10.5 08/27/2022   HGB 14.2 08/27/2022   HCT 41.7 08/27/2022   MCV 83.6 08/27/2022   PLT 285.0 08/27/2022   Lab Results  Component Value Date   TSH 3.46 08/27/2022   Lab Results  Component Value Date   CREATININE 1.15 08/27/2022   BUN 13 08/27/2022   NA 139 08/27/2022   K 3.8 08/27/2022   CL 105 08/27/2022   CO2 25 08/27/2022   She experiences heat intolerance, sweating, and nausea as side effects of the Lexapro, which she also experienced while on Wellbutrin. She has adjusted the timing of her medication to take it at night to minimize the impact of these side effects. She does not wish to change her medication, as she feels the benefits outweigh the side effects.     11/26/2022    4:25 PM 06/28/2022    3:52 PM 02/06/2021    3:09 PM 02/28/2020    4:37 PM 12/10/2019   11:35 AM  Depression screen PHQ 2/9  Decreased Interest 0 0 1 1 0  Down, Depressed, Hopeless 0 0 1 1 0  PHQ - 2 Score 0 0 2 2 0  Altered sleeping 1 0 0 3   Tired,  decreased energy 1 0 1 0   Change in appetite 1 0 1 1   Feeling bad or failure about yourself  0 0 1 1   Trouble concentrating 1 0 1 1   Moving slowly or fidgety/restless 0 0 2 2   Suicidal thoughts 0 0 0 0   PHQ-9 Score 4 0 8 10   Difficult doing work/chores Somewhat difficult Not difficult at all Somewhat difficult Somewhat difficult       11/26/2022    4:25 PM 02/06/2021    9:17 PM 02/28/2020    4:38 PM  GAD 7 : Generalized Anxiety Score  Nervous, Anxious, on Edge 1 2 2   Control/stop worrying 1 2 2   Worry too much - different things 1 2 3   Trouble relaxing 1 2 2   Restless 0 1 1  Easily annoyed or irritable 1 2 2   Afraid - awful might happen 1 2 2   Total GAD 7 Score 6 13 14   Anxiety Difficulty Somewhat difficult Somewhat difficult Very difficult   Review of Systems  Constitutional:  Positive for fatigue. Negative for activity change, chills and unexpected weight change.  HENT:  Negative for sore throat and trouble swallowing.   Respiratory:  Negative for shortness of breath.   Cardiovascular:  Negative for chest pain and palpitations.  Gastrointestinal:  Negative for abdominal pain and vomiting.  Neurological:  Negative for syncope and headaches.  Psychiatric/Behavioral:  Negative for confusion and hallucinations.   See other pertinent positives and negatives in HPI.  Current Outpatient Medications on File Prior to Visit  Medication Sig Dispense Refill   albuterol (VENTOLIN HFA) 108 (90 Base) MCG/ACT inhaler TAKE 2 PUFFS BY MOUTH EVERY 6 HOURS AS NEEDED FOR WHEEZE OR SHORTNESS OF BREATH 8.5 each 0   amphetamine-dextroamphetamine (ADDERALL XR) 20 MG 24 hr capsule Take 1 capsule (20 mg total) by mouth every morning. 30 capsule 0   dicyclomine (BENTYL) 10 MG capsule Take 1 capsule (10 mg total) by mouth 4 (four) times daily -  before meals and at bedtime. 30 capsule 2   Prenatal MV & Min w/FA-DHA (PRENATAL GUMMIES PO) Take by mouth.     No current facility-administered  medications on file prior to visit.    Past Medical History:  Diagnosis Date   Allergy    Anxiety    Depression    GERD (gastroesophageal reflux disease)    Hypothyroidism    Pregnancy induced hypertension    Allergies  Allergen Reactions   Cat Hair Extract     Social History   Socioeconomic History   Marital status: Married    Spouse name: Not on file   Number of children: Not on file   Years of education: Not on file   Highest education level: Some college, no degree  Occupational History   Not on file  Tobacco Use   Smoking status: Never   Smokeless tobacco: Never  Vaping Use   Vaping Use: Never used  Substance and Sexual Activity   Alcohol use: Not Currently   Drug use: Never   Sexual activity: Yes  Other Topics Concern   Not on file  Social History Narrative   Not on file   Social Determinants of Health   Financial Resource Strain: Medium Risk (11/25/2022)   Overall Financial Resource Strain (CARDIA)    Difficulty of Paying Living Expenses: Somewhat hard  Food Insecurity: No Food Insecurity (11/25/2022)   Hunger Vital Sign    Worried About Running Out of Food in the Last Year: Never true    Ran Out of Food in the Last Year: Never true  Transportation Needs: No Transportation Needs (11/25/2022)   PRAPARE - Administrator, Civil Service (Medical): No    Lack of Transportation (Non-Medical): No  Physical Activity: Insufficiently Active (11/25/2022)   Exercise Vital Sign    Days of Exercise per Week: 3 days    Minutes of Exercise per Session: 20 min  Stress: No Stress Concern Present (11/25/2022)   [employer] of Occupational Health - Occupational Stress Questionnaire    Feeling of Stress : Only a little  Social Connections: Socially Isolated (11/25/2022)   Social Connection and Isolation Panel [NHANES]    Frequency of Communication with Friends and Family: Twice a week    Frequency of Social Gatherings with Friends and Family: Never    Attends  Religious Services: Never    Database administrator or Organizations: No    Attends Banker Meetings: Not on file    Marital Status: Married   Vitals:   11/26/22 1559  BP: 118/70  Pulse: 91  Resp: 12  SpO2: 96%   Wt Readings from Last 3 Encounters:  11/26/22 257 lb 2 oz (116.6 kg)  08/27/22 259 lb 2 oz (117.5 kg)  06/28/22 261 lb 6  oz (118.6 kg)  Body mass index is 44.14 kg/m.  Physical Exam Vitals and nursing note reviewed.  Constitutional:      General: She is not in acute distress.    Appearance: She is well-developed.  HENT:     Head: Normocephalic and atraumatic.  Eyes:     Conjunctiva/sclera: Conjunctivae normal.  Cardiovascular:     Rate and Rhythm: Normal rate and regular rhythm.     Heart sounds: No murmur heard. Pulmonary:     Effort: Pulmonary effort is normal. No respiratory distress.     Breath sounds: Normal breath sounds.  Abdominal:     Palpations: Abdomen is soft. There is no mass.     Tenderness: There is no abdominal tenderness.  Skin:    General: Skin is warm.     Findings: No erythema or rash.  Neurological:     General: No focal deficit present.     Mental Status: She is alert and oriented to person, place, and time.     Gait: Gait normal.  Psychiatric:        Mood and Affect: Mood and affect normal.        Thought Content: Thought content does not include suicidal ideation. Thought content does not include suicidal plan.   ASSESSMENT AND PLAN:  Sarah Whitney was seen today for medical management of chronic issues.  Diagnoses and all orders for this visit:  Attention deficit hyperactivity disorder (ADHD), combined type Assessment & Plan: Problem is well controlled. Continue Adderall XR 20 mg. Some side effects of medication reviewed. PMP reviewed. Medication contract is current. F/U in 4-5 months, before if needed. She is planning on moving to [place] later this month, so will establish with new provider.   GAD  (generalized anxiety disorder) Assessment & Plan: Reporting improvement with Lexapro 10 mg. She is reporting some mild side effect from medication but would like to continue it.  F/U in 4-5 months, before if needed.  Orders: -     Escitalopram Oxalate; Take 1 tablet (10 mg total) by mouth daily. NEED APPT FOR FUTURE REFILLS.  Dispense: 90 tablet; Refill: 1  Morbid obesity (HCC) Assessment & Plan: Lost 2 Lb since her last visit. Consistency with healthy diet and physical activity encouraged.    Return in about 5 months (around 04/18/2023) for chronic problems. Moving to [place] , so needed a new PCP.  Dani Danis G. [place], MD  [facility]. [place] office.

## 2022-11-26 ENCOUNTER — Ambulatory Visit (INDEPENDENT_AMBULATORY_CARE_PROVIDER_SITE_OTHER): Payer: 59 | Admitting: Family Medicine

## 2022-11-26 ENCOUNTER — Encounter: Payer: Self-pay | Admitting: Family Medicine

## 2022-11-26 VITALS — BP 118/70 | HR 91 | Resp 12 | Ht 64.0 in | Wt 257.1 lb

## 2022-11-26 DIAGNOSIS — F411 Generalized anxiety disorder: Secondary | ICD-10-CM | POA: Diagnosis not present

## 2022-11-26 DIAGNOSIS — Z6841 Body Mass Index (BMI) 40.0 and over, adult: Secondary | ICD-10-CM | POA: Diagnosis not present

## 2022-11-26 DIAGNOSIS — F902 Attention-deficit hyperactivity disorder, combined type: Secondary | ICD-10-CM | POA: Diagnosis not present

## 2022-11-26 MED ORDER — AMPHETAMINE-DEXTROAMPHET ER 20 MG PO CP24: 20.0000 mg | ORAL_CAPSULE | ORAL | 0 refills | Status: DC

## 2022-11-26 MED ORDER — ESCITALOPRAM OXALATE 10 MG PO TABS: 10.0000 mg | ORAL_TABLET | Freq: Every day | ORAL | 1 refills | Status: DC

## 2022-11-26 NOTE — Assessment & Plan Note (Addendum)
Lost 2 Lb since her last visit. Consistency with healthy diet and physical activity encouraged.

## 2022-11-26 NOTE — Assessment & Plan Note (Addendum)
Reporting improvement with Lexapro 10 mg. She is reporting some mild side effect from medication but would like to continue it.  F/U in 4-5 months, before if needed.

## 2022-11-26 NOTE — Assessment & Plan Note (Signed)
Problem is well controlled. Continue Adderall XR 20 mg. Some side effects of medication reviewed. PMP reviewed. Medication contract is current. F/U in 4-5 months, before if needed. She is planning on moving to [place] later this month, so will establish with new provider.

## 2022-11-26 NOTE — Patient Instructions (Signed)
A few things to remember from today's visit:  Attention deficit hyperactivity disorder (ADHD), combined type  GAD (generalized anxiety disorder) - Plan: escitalopram (LEXAPRO) 10 MG tablet  No changes today. You need to establish with new provider in [place]. Next appt in 4-5 months. Let me know about your new pharmacy.   If you need refills for medications you take chronically, please call your pharmacy. Do not use My Chart to request refills or for acute issues that need immediate attention. If you send a my chart message, it may take a few days to be addressed, specially if I am not in the office.  Please be sure medication list is accurate. If a new problem present, please set up appointment sooner than planned today.

## 2022-12-02 ENCOUNTER — Other Ambulatory Visit: Payer: Self-pay | Admitting: Family Medicine

## 2022-12-02 MED ORDER — ALBUTEROL SULFATE HFA 108 (90 BASE) MCG/ACT IN AERS: INHALATION_SPRAY | RESPIRATORY_TRACT | 0 refills | Status: DC

## 2022-12-29 ENCOUNTER — Other Ambulatory Visit: Payer: Self-pay | Admitting: Family Medicine

## 2022-12-30 ENCOUNTER — Encounter: Payer: Self-pay | Admitting: Family Medicine

## 2022-12-31 ENCOUNTER — Other Ambulatory Visit: Payer: Self-pay | Admitting: Family Medicine

## 2022-12-31 DIAGNOSIS — F902 Attention-deficit hyperactivity disorder, combined type: Secondary | ICD-10-CM

## 2022-12-31 MED ORDER — AMPHETAMINE-DEXTROAMPHET ER 20 MG PO CP24: 20.0000 mg | ORAL_CAPSULE | ORAL | 0 refills | Status: DC

## 2022-12-31 NOTE — Telephone Encounter (Signed)
Prescription Request  12/31/2022  LOV: 11/26/2022  What is the name of the medication or equipment?  amphetamine-dextroamphetamine (ADDERALL XR) 20 MG 24 hr capsule   Have you contacted your pharmacy to request a refill? No   Which pharmacy would you like this sent to?   [id number] [place], [place] - [address] [facility] [place] [address] Phone: [phone] Fax: [phone]  Patient notified that their request is being sent to the clinical staff for review and that they should receive a response within 2 business days.   Please advise at Mobile [phone] (mobile)

## 2023-01-30 ENCOUNTER — Other Ambulatory Visit: Payer: Self-pay | Admitting: Family Medicine

## 2023-01-30 DIAGNOSIS — F902 Attention-deficit hyperactivity disorder, combined type: Secondary | ICD-10-CM

## 2023-01-30 NOTE — Telephone Encounter (Signed)
Prescription Request  01/30/2023  LOV: 11/26/2022  What is the name of the medication or equipment? amphetamine-dextroamphetamine (ADDERALL XR) 20 MG 24 hr capsule   Have you contacted your pharmacy to request a refill? No   Which pharmacy would you like this sent to?   [id number] [place], [place] - [address] [facility] [place] [address] Phone: [phone] Fax: [phone]    Patient notified that their request is being sent to the clinical staff for review and that they should receive a response within 2 business days.   Please advise at Mobile [phone] (mobile)

## 2023-01-31 MED ORDER — AMPHETAMINE-DEXTROAMPHET ER 20 MG PO CP24: 20.0000 mg | ORAL_CAPSULE | ORAL | 0 refills | Status: DC

## 2023-01-31 NOTE — Addendum Note (Signed)
Addended by: Kathreen Devoid on: 01/31/2023 07:22 AM   Modules accepted: Orders

## 2023-03-03 ENCOUNTER — Other Ambulatory Visit: Payer: Self-pay | Admitting: Family Medicine

## 2023-03-03 DIAGNOSIS — F902 Attention-deficit hyperactivity disorder, combined type: Secondary | ICD-10-CM

## 2023-03-04 MED ORDER — AMPHETAMINE-DEXTROAMPHET ER 20 MG PO CP24: 20.0000 mg | ORAL_CAPSULE | ORAL | 0 refills | Status: DC

## 2023-04-03 ENCOUNTER — Other Ambulatory Visit: Payer: Self-pay | Admitting: Family Medicine

## 2023-04-03 DIAGNOSIS — F902 Attention-deficit hyperactivity disorder, combined type: Secondary | ICD-10-CM

## 2023-04-11 ENCOUNTER — Other Ambulatory Visit: Payer: Self-pay | Admitting: Family Medicine

## 2023-04-11 DIAGNOSIS — F902 Attention-deficit hyperactivity disorder, combined type: Secondary | ICD-10-CM

## 2023-04-11 MED ORDER — AMPHETAMINE-DEXTROAMPHET ER 20 MG PO CP24: 20.0000 mg | ORAL_CAPSULE | ORAL | 0 refills | Status: DC

## 2023-05-15 ENCOUNTER — Other Ambulatory Visit: Payer: Self-pay | Admitting: Family Medicine

## 2023-05-15 DIAGNOSIS — F902 Attention-deficit hyperactivity disorder, combined type: Secondary | ICD-10-CM

## 2023-05-15 NOTE — Telephone Encounter (Signed)
Copied from CRM [id number]. Topic: Clinical - Medication Refill >> May 15, 2023  3:50 PM Efraim Kaufmann C wrote: Most Recent Primary Care Visit:  Provider: [place], BETTY G  Department: [facility]  Visit Type: OFFICE VISIT  Date: 11/26/2022  Medication: amphetamine-dextroamphetamine (ADDERALL XR) 20 MG 24 hr capsule albuterol (VENTOLIN HFA) 108 (90 Base) MCG/ACT inhaler  Has the patient contacted their pharmacy? No (Agent: If no, request that the patient contact the pharmacy for the refill. If patient does not wish to contact the pharmacy document the reason why and proceed with request.) Patient tried to do through MyChart but could not log on (Agent: If yes, when and what did the pharmacy advise?)  Is this the correct pharmacy for this prescription? Yes If no, delete pharmacy and type the correct one.  This is the patient's preferred pharmacy:   [facility] [phone] [facility], [place] - [address] [facility] [place] [address] Phone: [phone] Fax: [phone]   Has the prescription been filled recently? No  Is the patient out of the medication? No  Has the patient been seen for an appointment in the last year OR does the patient have an upcoming appointment? Yes  Can we respond through MyChart? Yes  Agent: Please be advised that Rx refills may take up to 3 business days. We ask that you follow-up with your pharmacy.

## 2023-05-26 ENCOUNTER — Ambulatory Visit: Payer: 59 | Admitting: Family Medicine

## 2023-05-26 ENCOUNTER — Telehealth: Payer: Self-pay | Admitting: Family Medicine

## 2023-05-26 NOTE — Telephone Encounter (Signed)
Copied from CRM [id number]. Topic: Clinical - Medication Question >> May 26, 2023  9:10 AM Adaysia C wrote: Reason for CRM: Patient called in because she scheduled a in person appointment for 05/16/23 at 9a for a medication refill but she meant to schedule a virtual visit for a medication refill because she is still out of town. Please follow up with patient about her medication refill [phone]

## 2023-06-03 ENCOUNTER — Other Ambulatory Visit: Payer: Self-pay | Admitting: Family Medicine

## 2023-06-03 ENCOUNTER — Encounter: Payer: Self-pay | Admitting: Family Medicine

## 2023-06-03 ENCOUNTER — Telehealth: Payer: 59 | Admitting: Family Medicine

## 2023-06-03 VITALS — Ht 64.0 in

## 2023-06-03 DIAGNOSIS — F411 Generalized anxiety disorder: Secondary | ICD-10-CM

## 2023-06-03 DIAGNOSIS — F330 Major depressive disorder, recurrent, mild: Secondary | ICD-10-CM

## 2023-06-03 DIAGNOSIS — F902 Attention-deficit hyperactivity disorder, combined type: Secondary | ICD-10-CM

## 2023-06-03 MED ORDER — AMPHETAMINE-DEXTROAMPHET ER 20 MG PO CP24: 20.0000 mg | ORAL_CAPSULE | ORAL | 0 refills | Status: AC

## 2023-06-03 MED ORDER — AMPHETAMINE-DEXTROAMPHET ER 20 MG PO CP24: 20.0000 mg | ORAL_CAPSULE | ORAL | 0 refills | Status: DC

## 2023-06-03 MED ORDER — ESCITALOPRAM OXALATE 10 MG PO TABS: 10.0000 mg | ORAL_TABLET | Freq: Every day | ORAL | 2 refills | Status: DC

## 2023-06-03 NOTE — Assessment & Plan Note (Signed)
 Problem is stable. Continue Lexapro 10 mg daily. Follow-up in 5 to 6 months, before if needed.

## 2023-06-03 NOTE — Assessment & Plan Note (Signed)
 Problem is well controlled. Continue Adderall XR 20 mg, Rx's x 2 sent. Some side effects of medication reviewed. PMP reviewed. Medication contract 08/2022. F/U in 5-6 months with new pcp in [place] or here in the office.

## 2023-06-03 NOTE — Assessment & Plan Note (Signed)
 Reporting problem as well controlled. Continue Lexapro 10 mg daily. F/U in 5-6 months here in the office or with new PCP in [place].

## 2023-06-03 NOTE — Progress Notes (Signed)
 Virtual Visit via Video Note I connected with Sarah Whitney on 06/03/2023 by a video enabled telemedicine application and verified that I am speaking with the correct person using two identifiers. Location patient: home Location provider:work office Persons participating in the virtual visit: patient, scribe,provider  I discussed the limitations of evaluation and management by telemedicine and the availability of in person appointments. The patient expressed understanding and agreed to proceed.  Chief Complaint  Patient presents with   Medical Management of Chronic Issues   HPI: [name] is a 33 y.o. female with a PMHx significant for moderate intermittent asthma, IBS with diarrhea,ADHD, depression,anxiety, and chronic fatigue who is being seen on video today for medication follow up.  Last follow up visit 11/26/22, she is now living in [facility], has not tried to find a new PCP.  ADHD Dx'ed in 11/2019. She has been on Adderall for years, discontinued during pregnancies. She tried Ritalin  before but was not effective.  Not formally Dx'ed until adulthood but she feels like she had ADHD like symptoms since high school.  She has dropped out of college a few times. Since on Adderall XL 20 mg daily , she is able to complete assignments/chores at home, not easily distracted, and more organized.   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   Depression and anxiety: Currently on Lexapro  10 mg daily. Took Wellbutrin  for a few months for post partum depression.  She occasionally bites her nails, but says that has done so for years and not aggravated by Adderall. She reports she is sleeping ~7 hours per night.   LMP: on birth control  ROS: See pertinent positives and negatives per HPI.  Past Medical History:  Diagnosis Date   Allergy    Anxiety    Depression    GERD (gastroesophageal reflux disease)    Hypothyroidism    Pregnancy induced hypertension     Past Surgical History:  Procedure  Laterality Date   CESAREAN SECTION N/A 12/13/2021   Procedure: CESAREAN SECTION;  Surgeon: Okey Leader, MD;  Location: [facility]S;  Service: Obstetrics;  Laterality: N/A;   NO PAST SURGERIES      Family History  Problem Relation Age of Onset   Cancer Mother    Hypothyroidism Father    Stroke Paternal Grandmother    Heart disease Paternal Grandmother    Diabetes Neg Hx     Social History   Socioeconomic History   Marital status: Married    Spouse name: Not on file   Number of children: Not on file   Years of education: Not on file   Highest education level: Some college, no degree  Occupational History   Not on file  Tobacco Use   Smoking status: Never   Smokeless tobacco: Never  Vaping Use   Vaping status: Never Used  Substance and Sexual Activity   Alcohol use: Not Currently   Drug use: Never   Sexual activity: Yes  Other Topics Concern   Not on file  Social History Narrative   Not on file   Social Drivers of Health   Financial Resource Strain: Medium Risk (11/25/2022)   Overall Financial Resource Strain (CARDIA)    Difficulty of Paying Living Expenses: Somewhat hard  Food Insecurity: No Food Insecurity (11/25/2022)   Hunger Vital Sign    Worried About Running Out of Food in the Last Year: Never true    Ran Out of Food in the Last Year: Never true  Transportation Needs: No Transportation  Needs (11/25/2022)   PRAPARE - Administrator, Civil Service (Medical): No    Lack of Transportation (Non-Medical): No  Physical Activity: Insufficiently Active (11/25/2022)   Exercise Vital Sign    Days of Exercise per Week: 3 days    Minutes of Exercise per Session: 20 min  Stress: No Stress Concern Present (11/25/2022)   [employer] of Occupational Health - Occupational Stress Questionnaire    Feeling of Stress : Only a little  Social Connections: Socially Isolated (11/25/2022)   Social Connection and Isolation Panel [NHANES]    Frequency of Communication with  Friends and Family: Twice a week    Frequency of Social Gatherings with Friends and Family: Never    Attends Religious Services: Never    Diplomatic Services Operational Officer: No    Attends Engineer, Structural: Not on file    Marital Status: Married  Catering Manager Violence: Not on file    Current Outpatient Medications:    albuterol  (VENTOLIN  HFA) 108 (90 Base) MCG/ACT inhaler, INHALE 2 PUFFS BY MOUTH EVERY 6 HOURS AS NEEDED FOR WHEEZING OR SHORTNESS OF BREATH, Disp: 18 each, Rfl: 2   dicyclomine  (BENTYL ) 10 MG capsule, Take 1 capsule (10 mg total) by mouth 4 (four) times daily -  before meals and at bedtime., Disp: 30 capsule, Rfl: 2   HAILEY 24 FE 1-20 MG-MCG(24) tablet, Take 1 tablet by mouth daily., Disp: , Rfl:    Prenatal MV & Min w/FA-DHA (PRENATAL GUMMIES PO), Take by mouth., Disp: , Rfl:    amphetamine -dextroamphetamine (ADDERALL XR) 20 MG 24 hr capsule, Take 1 capsule (20 mg total) by mouth every morning., Disp: 30 capsule, Rfl: 0   amphetamine -dextroamphetamine (ADDERALL XR) 20 MG 24 hr capsule, Take 1 capsule (20 mg total) by mouth every morning., Disp: 30 capsule, Rfl: 0   escitalopram  (LEXAPRO ) 10 MG tablet, Take 1 tablet (10 mg total) by mouth daily., Disp: 90 tablet, Rfl: 2  EXAM:  VITALS per patient if applicable:Ht 5' 4 (1.626 m)   BMI 44.14 kg/m   GENERAL: alert, oriented, appears well and in no acute distress  HEENT: atraumatic, conjunctiva clear, no obvious abnormalities on inspection of external nose and ears  NECK: normal movements of the head and neck  LUNGS: on inspection no signs of respiratory distress, breathing rate appears normal, no obvious gross SOB, gasping or wheezing  CV: no obvious cyanosis  MS: moves all visible extremities without noticeable abnormality  PSYCH/NEURO: pleasant and cooperative, no obvious depression or anxiety, speech and thought processing grossly intact  ASSESSMENT AND PLAN:  Discussed the following  assessment and plan:  Attention deficit hyperactivity disorder (ADHD), combined type Assessment & Plan: Problem is well controlled. Continue Adderall XR 20 mg, Rx's x 2 sent. Some side effects of medication reviewed. PMP reviewed. Medication contract 08/2022. F/U in 5-6 months with new pcp in [place] or here in the office.  Orders: -     Amphetamine -Dextroamphet ER; Take 1 capsule (20 mg total) by mouth every morning.  Dispense: 30 capsule; Refill: 0 -     Amphetamine -Dextroamphet ER; Take 1 capsule (20 mg total) by mouth every morning.  Dispense: 30 capsule; Refill: 0  GAD (generalized anxiety disorder) Assessment & Plan: Problem is stable. Continue Lexapro  10 mg daily. Follow-up in 5 to 6 months, before if needed.  Orders: -     Escitalopram  Oxalate; Take 1 tablet (10 mg total) by mouth daily.  Dispense: 90 tablet; Refill: 2  Depression, major, recurrent, mild (HCC) Assessment & Plan: Reporting problem as well controlled. Continue Lexapro  10 mg daily. F/U in 5-6 months here in the office or with new PCP in [place].    We discussed possible serious and likely etiologies, options for evaluation and workup, limitations of telemedicine visit vs in person visit, treatment, treatment risks and precautions. The patient was advised to call back or seek an in-person evaluation if the symptoms worsen or if the condition fails to improve as anticipated. I discussed the assessment and treatment plan with the patient. The patient was provided an opportunity to ask questions and all were answered. The patient agreed with the plan and demonstrated an understanding of the instructions.  Return in about 6 months (around 12/01/2023).  I, Sarah Whitney, acting as a scribe for Sarah Granberry, MD., have documented all relevant documentation on the behalf of Sarah Snyders, MD, as directed by  Sarah Hirsch, MD while in the presence of Sarah Kinn, MD.   I, Sarah Tugwell, MD, have reviewed all  documentation for this visit. The documentation on 06/03/23 for the exam, diagnosis, procedures, and orders are all accurate and complete.  Sarah Onnen, MD

## 2023-06-04 ENCOUNTER — Other Ambulatory Visit: Payer: Self-pay

## 2023-06-04 DIAGNOSIS — F902 Attention-deficit hyperactivity disorder, combined type: Secondary | ICD-10-CM

## 2023-06-04 MED ORDER — AMPHETAMINE-DEXTROAMPHET ER 20 MG PO CP24: 20.0000 mg | ORAL_CAPSULE | ORAL | 0 refills | Status: DC

## 2023-06-04 NOTE — Telephone Encounter (Signed)
 Copied from CRM [id number]. Topic: Clinical - Prescription Issue >> Jun 04, 2023 12:32 PM Sarah Whitney wrote: Reason for CRM: Patients pharmacy states that the dates for amphetamine -dextroamphetamine (ADDERALL XR) 20 MG 24 hr capsule are incorrect and they state the date the patient can pick them up is not until 2/3 Patient is requesting Dr. Jordan to re-send the prescriptions with the correct dates.

## 2023-08-07 ENCOUNTER — Other Ambulatory Visit: Payer: Self-pay | Admitting: Family Medicine

## 2023-08-07 DIAGNOSIS — F902 Attention-deficit hyperactivity disorder, combined type: Secondary | ICD-10-CM

## 2023-08-11 MED ORDER — AMPHETAMINE-DEXTROAMPHET ER 20 MG PO CP24: 20.0000 mg | ORAL_CAPSULE | ORAL | 0 refills | Status: DC

## 2023-09-06 IMAGING — CT CT ANGIO CHEST
3 of 7 series · 19 of 36 positions shown · IV contrast (omnipaque)
Comparison: Chest x-ray from same day.

CLINICAL DATA: Left chest pain radiating into the left shoulder
since yesterday. 10 weeks pregnant.

EXAM:
CT ANGIOGRAPHY CHEST WITH CONTRAST
TECHNIQUE: Multidetector CT imaging of the chest was performed using the
standard protocol during bolus administration of intravenous
contrast. Multiplanar CT image reconstructions and MIPs were
obtained to evaluate the vascular anatomy.
CONTRAST:  100mL OMNIPAQUE IOHEXOL 350 MG/ML SOLN

[Series 6: pe thins · axial · 0.98mm/px · z∈[+1085,+1325]mm · 15 of 392 slices shown]
[im 25/392  lung]
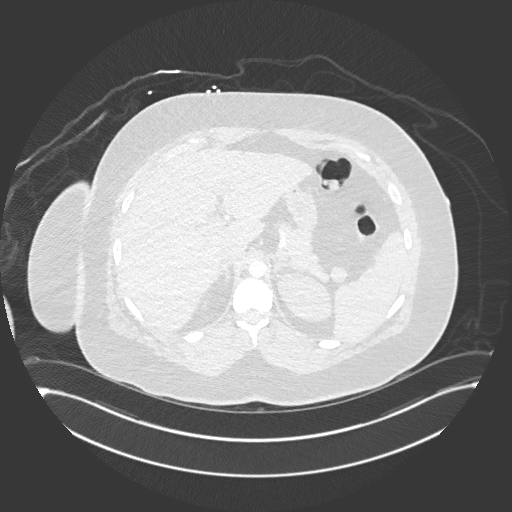
[im 49/392  mediastinal]
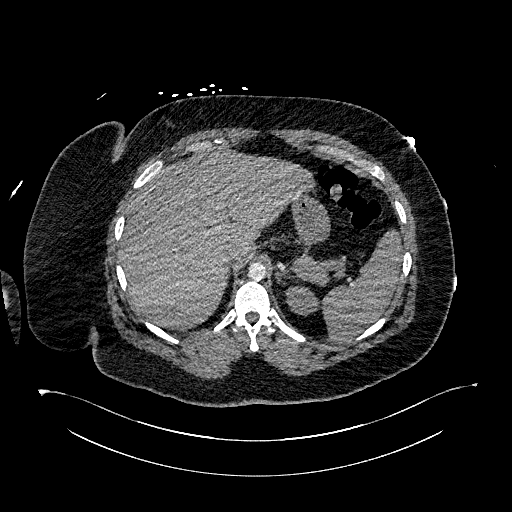
[im 74/392  lung]
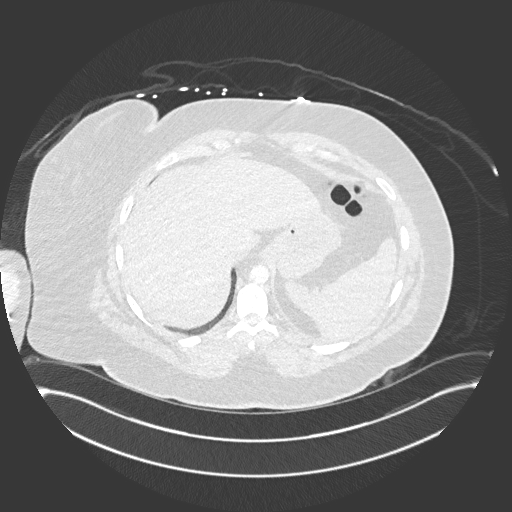
[im 98/392  mediastinal]
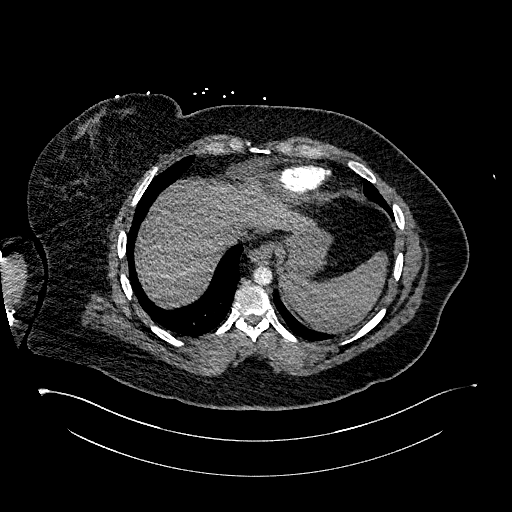
[im 123/392  lung]
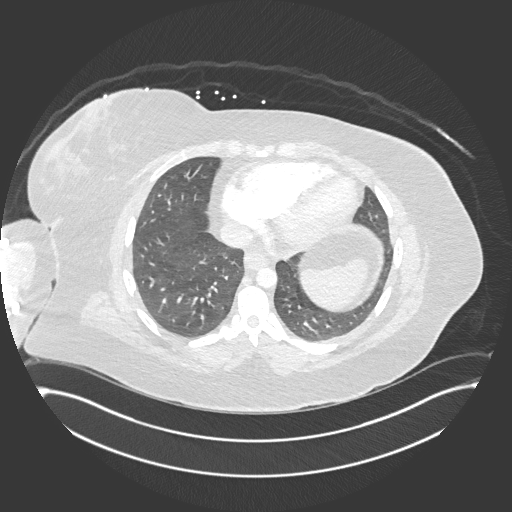
[im 147/392  mediastinal]
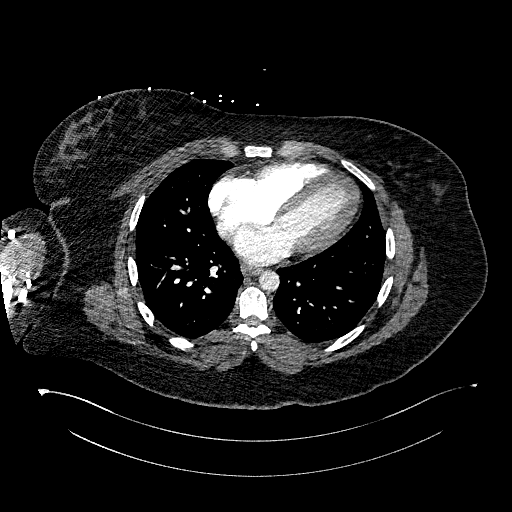
[im 172/392  lung]
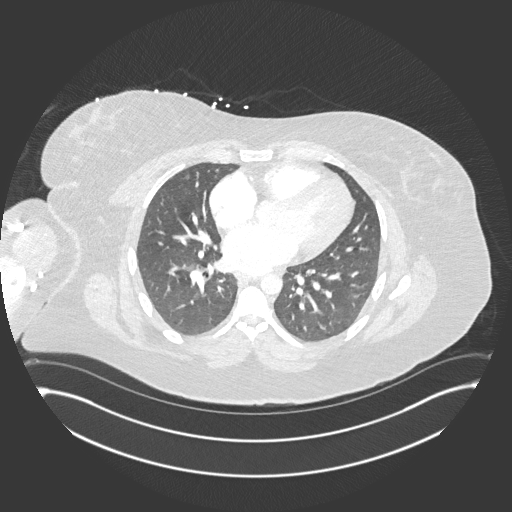
[im 196/392  mediastinal]
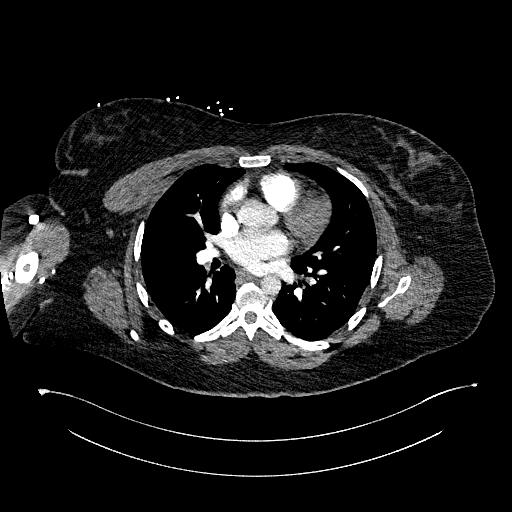
[im 220/392  lung]
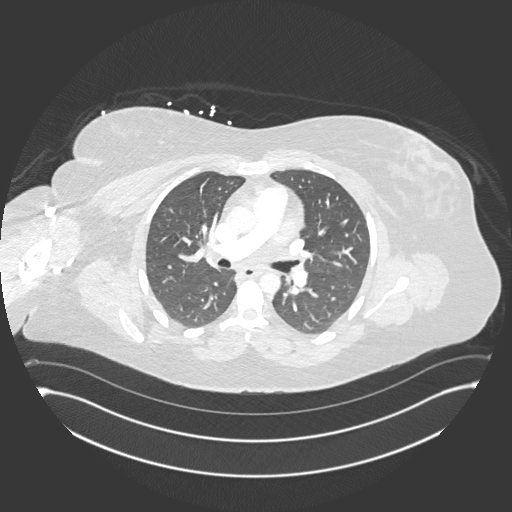
[im 245/392  mediastinal]
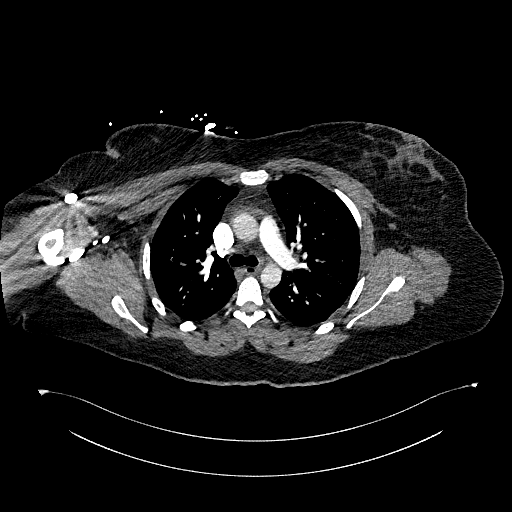
[im 269/392  lung]
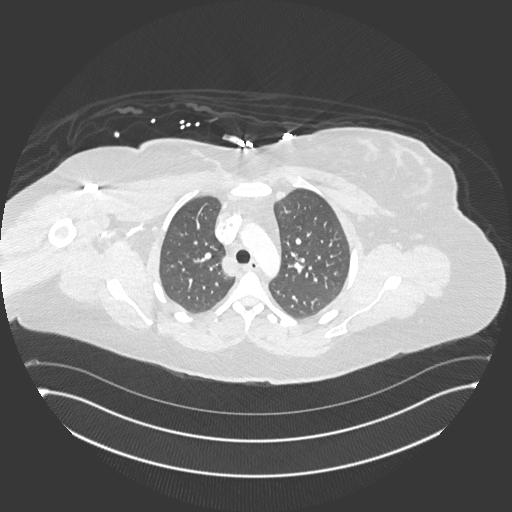
[im 294/392  mediastinal]
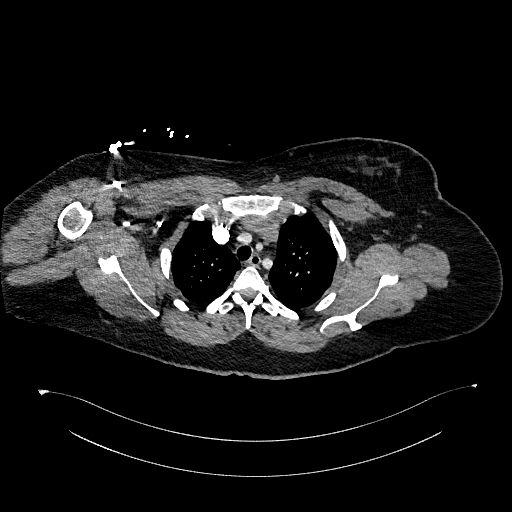
[im 318/392  lung]
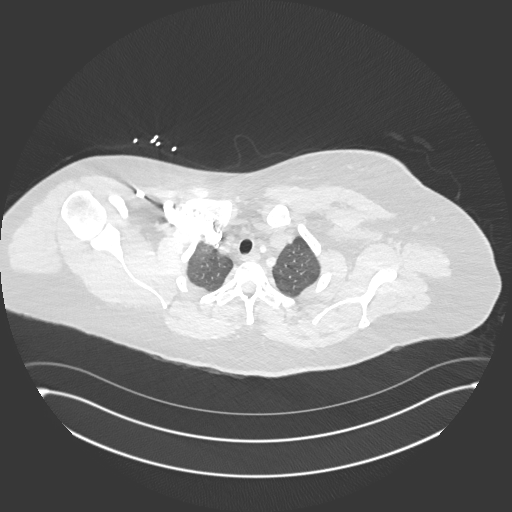
[im 343/392  mediastinal]
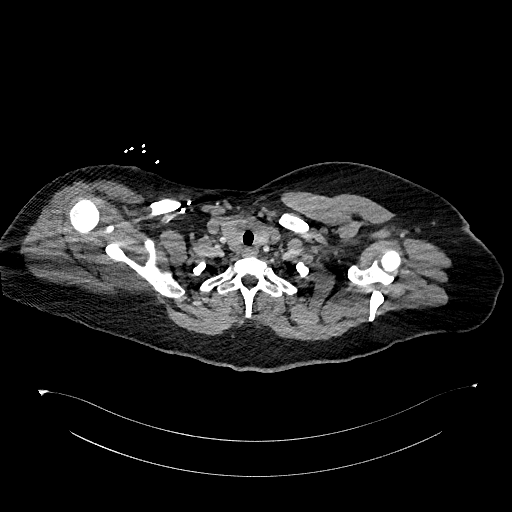
[im 367/392  lung]
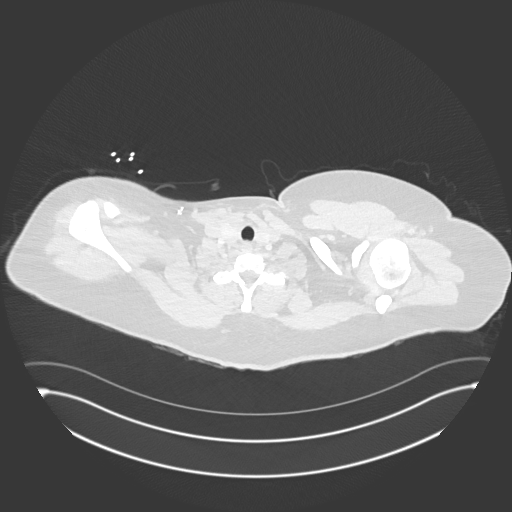

[Series 7: pe lung · axial · 0.63mm/px · z∈[+1146,+1258]mm · 3 of 114 slices shown]
[im 29/114  mediastinal]
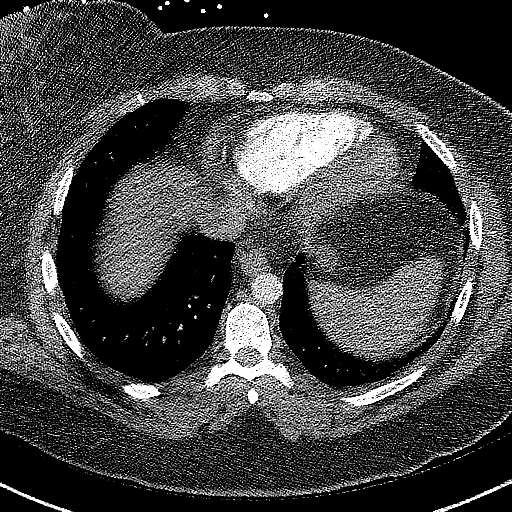
[im 57/114  mediastinal]
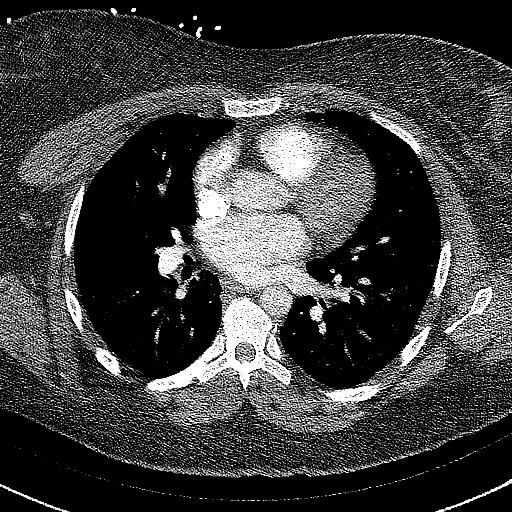
[im 85/114  mediastinal]
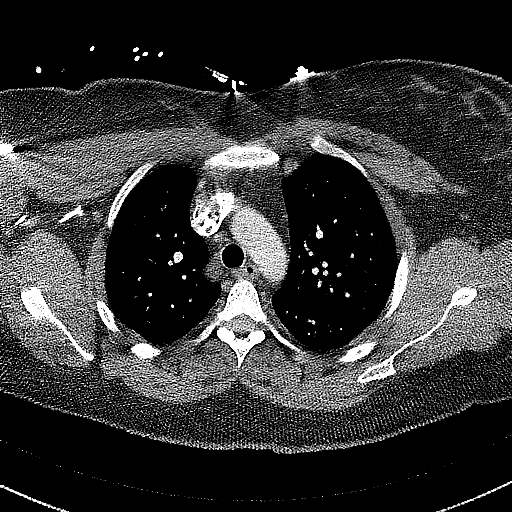

[Series 8: pe 2mm cor · coronal · 0.55mm/px · 1 of 151 slices shown]
[im 76/151  mediastinal]
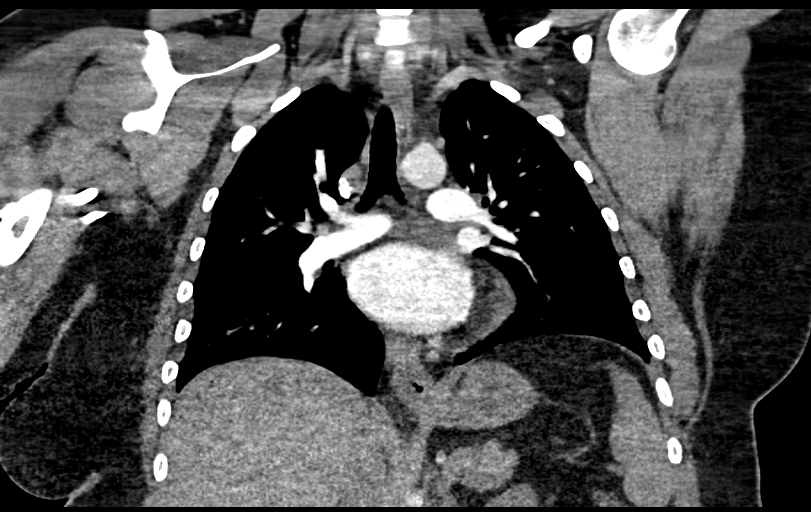

[19 of 36 positions shown; findings below may reference images not displayed]

FINDINGS: Cardiovascular: Satisfactory opacification of the pulmonary arteries
to the segmental level. No evidence of pulmonary embolism. Normal
heart size. No pericardial effusion. No thoracic aortic aneurysm or
dissection.

Mediastinum/Nodes: No enlarged mediastinal, hilar, or axillary lymph
nodes. Thyroid gland, trachea, and esophagus demonstrate no
significant findings.

Lungs/Pleura: Lungs are clear. No pleural effusion or pneumothorax.

Upper Abdomen: No acute abnormality.

Musculoskeletal: No chest wall abnormality. No acute or significant
osseous findings.

Review of the MIP images confirms the above findings.
IMPRESSION: 1. Normal CTA of the chest. No evidence of pulmonary embolism.

## 2023-09-09 ENCOUNTER — Other Ambulatory Visit: Payer: Self-pay | Admitting: Family Medicine

## 2023-09-09 DIAGNOSIS — F902 Attention-deficit hyperactivity disorder, combined type: Secondary | ICD-10-CM

## 2023-09-15 MED ORDER — AMPHETAMINE-DEXTROAMPHET ER 20 MG PO CP24: 20.0000 mg | ORAL_CAPSULE | ORAL | 0 refills | Status: AC

## 2023-11-01 ENCOUNTER — Other Ambulatory Visit: Payer: Self-pay | Admitting: Family Medicine

## 2024-06-12 ENCOUNTER — Other Ambulatory Visit: Payer: Self-pay | Admitting: Family Medicine

## 2024-06-12 DIAGNOSIS — F411 Generalized anxiety disorder: Secondary | ICD-10-CM
